# Patient Record
Sex: Male | Born: 1960 | Hispanic: Yes | Marital: Married | Smoking: Never smoker
Health system: Southern US, Community
[De-identification: ages and names within clinical notes are randomized; demographics above are authoritative.]

## PROBLEM LIST (undated history)

## (undated) DIAGNOSIS — I10 Essential (primary) hypertension: Secondary | ICD-10-CM

---

## 2020-10-02 ENCOUNTER — Emergency Department
Admission: EM | Admit: 2020-10-02 | Discharge: 2020-10-02 | Disposition: A | Discharge status: Home / Self Care | Payer: HRSA Program | Attending: Emergency Medicine | Admitting: Emergency Medicine

## 2020-10-02 ENCOUNTER — Other Ambulatory Visit: Payer: Self-pay

## 2020-10-02 ENCOUNTER — Encounter: Payer: Self-pay | Admitting: Emergency Medicine

## 2020-10-02 DIAGNOSIS — U071 COVID-19: Secondary | ICD-10-CM | POA: Insufficient documentation

## 2020-10-02 DIAGNOSIS — R509 Fever, unspecified: Secondary | ICD-10-CM | POA: Diagnosis present

## 2020-10-02 LAB — RESP PANEL BY RT-PCR (FLU A&B, COVID) ARPGX2
Influenza A by PCR: NEGATIVE
Influenza B by PCR: NEGATIVE
SARS Coronavirus 2 by RT PCR: POSITIVE — AB

## 2020-10-02 MED ORDER — DM-GUAIFENESIN ER 30-600 MG PO TB12: 1.0000 | ORAL_TABLET | Freq: Two times a day (BID) | ORAL | 0 refills | Status: AC

## 2020-10-02 MED ORDER — BENZONATATE 100 MG PO CAPS: 100.0000 mg | ORAL_CAPSULE | Freq: Three times a day (TID) | ORAL | 0 refills | Status: AC | PRN

## 2020-10-02 MED ORDER — ALBUTEROL SULFATE HFA 108 (90 BASE) MCG/ACT IN AERS: 2.0000 | INHALATION_SPRAY | Freq: Four times a day (QID) | RESPIRATORY_TRACT | 2 refills | Status: AC | PRN

## 2020-10-02 NOTE — Discharge Instructions (Signed)
Stay at home as much as possible until you recover  Take Tylenol and Ibuprofen for bodyaches and fever.  Return with new or worsening or symptoms.  You can use two puffs of Albuterol as needed for wheezing or shortness of breath.

## 2020-10-02 NOTE — ED Provider Notes (Signed)
[facility]-EMERGENCY DEPARTMENT  ____________________________________________  Time seen: Approximately 5:23 PM  I have reviewed the triage vital signs and the nursing notes.   HISTORY  Chief Complaint Generalized Body Aches   Historian Patient     HPI Kyle Wilson is a 59 y.o. male presents to the emergency department with fever, body aches, occasional cough and chills.  Patient states that he started having symptoms after he traveled to his daughter's house for the holidays.  He denies chest pain and chest tightness.  He states that he has had difficulty sleeping due to nasal congestion.  He states that his chills have improved but he is still having trouble sleeping.  There are no other sick contacts in the home with similar symptoms.  He denies emesis and diarrhea.  He reports that he has been otherwise healthy prior to onset of symptoms.  No other alleviating measures have been attempted.   History reviewed. No pertinent past medical history.   Immunizations up to date:  Yes.     History reviewed. No pertinent past medical history.  There are no problems to display for this patient.   History reviewed. No pertinent surgical history.  Prior to Admission medications   Medication Sig Start Date End Date Taking? Authorizing Provider  albuterol (VENTOLIN HFA) 108 (90 Base) MCG/ACT inhaler Inhale 2 puffs into the lungs every 6 (six) hours as needed for wheezing or shortness of breath. 10/02/20  Yes Pia Mau M, PA-C  benzonatate (TESSALON PERLES) 100 MG capsule Take 1 capsule (100 mg total) by mouth 3 (three) times daily as needed for up to 7 days for cough. 10/02/20 10/09/20 Yes Pia Mau M, PA-C  dextromethorphan-guaiFENesin (MUCINEX DM) 30-600 MG 12hr tablet Take 1 tablet by mouth 2 (two) times daily. 10/02/20  Yes Orvil Feil, PA-C    Allergies Patient has no known allergies.  No family history on file.  Social History Social History   Tobacco  Use  . Smoking status: Never Smoker  . Smokeless tobacco: Never Used     Review of Systems  Constitutional: Patient has chills.  Eyes:  No discharge ENT: No upper respiratory complaints. Respiratory: Patient has cough.  Gastrointestinal:   No nausea, no vomiting.  No diarrhea.  No constipation. Musculoskeletal: Negative for musculoskeletal pain. Skin: Negative for rash, abrasions, lacerations, ecchymosis.    ____________________________________________   PHYSICAL EXAM:  VITAL SIGNS: ED Triage Vitals  Enc Vitals Group     BP 10/02/20 1426 138/69     Pulse Rate 10/02/20 1426 88     Resp 10/02/20 1426 16     Temp 10/02/20 1426 99.4 F (37.4 C)     Temp Source 10/02/20 1426 Oral     SpO2 10/02/20 1426 96 %     Weight 10/02/20 1427 130 lb 1.1 oz (59 kg)     Height 10/02/20 1427 5' 4.57" (1.64 m)     Head Circumference --      Peak Flow --      Pain Score 10/02/20 1427 0     Pain Loc --      Pain Edu? --      Excl. in GC? --    Constitutional: Alert and oriented. Patient is lying supine. Eyes: Conjunctivae are normal. PERRL. EOMI. Head: Atraumatic. ENT:      Ears: Tympanic membranes are mildly injected with mild effusion bilaterally.       Nose: No congestion/rhinnorhea.      Mouth/Throat: Mucous membranes are moist. Posterior  pharynx is mildly erythematous.  Hematological/Lymphatic/Immunilogical: No cervical lymphadenopathy.  Cardiovascular: Normal rate, regular rhythm. Normal S1 and S2.  Good peripheral circulation. Respiratory: Normal respiratory effort without tachypnea or retractions. Lungs CTAB. Good air entry to the bases with no decreased or absent breath sounds. Gastrointestinal: Bowel sounds 4 quadrants. Soft and nontender to palpation. No guarding or rigidity. No palpable masses. No distention. No CVA tenderness. Musculoskeletal: Full range of motion to all extremities. No gross deformities appreciated. Neurologic:  Normal speech and language. No gross focal  neurologic deficits are appreciated.  Skin:  Skin is warm, dry and intact. No rash noted. Psychiatric: Mood and affect are normal. Speech and behavior are normal. Patient exhibits appropriate insight and judgement.    ____________________________________________   LABS (all labs ordered are listed, but only abnormal results are displayed)  Labs Reviewed  RESP PANEL BY RT-PCR (FLU A&B, COVID) ARPGX2 - Abnormal; Notable for the following components:      Result Value   SARS Coronavirus 2 by RT PCR POSITIVE (*)    All other components within normal limits   ____________________________________________  EKG   ____________________________________________  RADIOLOGY   No results found.  ____________________________________________    PROCEDURES  Procedure(s) performed:     Procedures     Medications - No data to display   ____________________________________________   INITIAL IMPRESSION / ASSESSMENT AND PLAN / ED COURSE  Pertinent labs & imaging results that were available during my care of the patient were reviewed by me and considered in my medical decision making (see chart for details).      Assessment and plan Viral URI with cough 59 year old male presents to the emergency department with viral URI-like symptoms.  He tested positive for COVID-19.  Vital signs are reassuring at triage.  On physical exam, patient was alert, active and nontoxic-appearing with no adventitious lung sounds auscultated.  He was discharged with Jerilynn Som, Mucinex DM and an albuterol inhaler.  Quarantine precautions were given.  Rest and hydration were encouraged at home.  Recommended returning for reevaluation if he develops worsening chest pain, chest tightness or shortness of breath.  Use of a Engineer, structural was used during this emergency department encounter.     ____________________________________________  FINAL CLINICAL IMPRESSION(S) / ED DIAGNOSES  Final  diagnoses:  COVID-19      NEW MEDICATIONS STARTED DURING THIS VISIT:  ED Discharge Orders         Ordered    albuterol (VENTOLIN HFA) 108 (90 Base) MCG/ACT inhaler  Every 6 hours PRN        10/02/20 1714    benzonatate (TESSALON PERLES) 100 MG capsule  3 times daily PRN        10/02/20 1714    dextromethorphan-guaiFENesin (MUCINEX DM) 30-600 MG 12hr tablet  2 times daily        10/02/20 1714              This chart was dictated using voice recognition software/Dragon. Despite best efforts to proofread, errors can occur which can change the meaning. Any change was purely unintentional.     Orvil Feil, PA-C 10/02/20 1726    Delton Prairie, MD 10/02/20 [phone]

## 2020-10-02 NOTE — ED Triage Notes (Signed)
C/O fevers, body aches, chills x 1 week.  States he hasn't been sleeping well either.  AAOx3.  Skin warm and dry.  NAD

## 2020-10-05 ENCOUNTER — Encounter: Payer: Self-pay | Admitting: Emergency Medicine

## 2020-10-05 ENCOUNTER — Inpatient Hospital Stay: Payer: Medicaid - Out of State

## 2020-10-05 ENCOUNTER — Emergency Department: Payer: Medicaid - Out of State

## 2020-10-05 ENCOUNTER — Inpatient Hospital Stay
Admission: EM | Admit: 2020-10-05 | Discharge: 2020-10-10 | DRG: 177 | Disposition: A | Discharge status: Home / Self Care | Payer: Medicaid - Out of State | Attending: Internal Medicine | Admitting: Internal Medicine

## 2020-10-05 ENCOUNTER — Other Ambulatory Visit: Payer: Self-pay

## 2020-10-05 DIAGNOSIS — U071 COVID-19: Principal | ICD-10-CM

## 2020-10-05 DIAGNOSIS — Z79899 Other long term (current) drug therapy: Secondary | ICD-10-CM | POA: Diagnosis not present

## 2020-10-05 DIAGNOSIS — J9600 Acute respiratory failure, unspecified whether with hypoxia or hypercapnia: Secondary | ICD-10-CM

## 2020-10-05 DIAGNOSIS — J1282 Pneumonia due to coronavirus disease 2019: Secondary | ICD-10-CM | POA: Diagnosis present

## 2020-10-05 DIAGNOSIS — R0902 Hypoxemia: Secondary | ICD-10-CM

## 2020-10-05 DIAGNOSIS — E785 Hyperlipidemia, unspecified: Secondary | ICD-10-CM | POA: Diagnosis present

## 2020-10-05 DIAGNOSIS — I10 Essential (primary) hypertension: Secondary | ICD-10-CM | POA: Diagnosis present

## 2020-10-05 DIAGNOSIS — J9601 Acute respiratory failure with hypoxia: Secondary | ICD-10-CM | POA: Diagnosis present

## 2020-10-05 HISTORY — DX: Essential (primary) hypertension: I10

## 2020-10-05 LAB — CBC
HCT: 41.9 % (ref 39.0–52.0)
HCT: 36.9 % — ABNORMAL LOW (ref 39.0–52.0)
Hemoglobin: 14.3 g/dL (ref 13.0–17.0)
Hemoglobin: 13 g/dL (ref 13.0–17.0)
MCH: 30.6 pg (ref 26.0–34.0)
MCH: 31.3 pg (ref 26.0–34.0)
MCHC: 34.1 g/dL (ref 30.0–36.0)
MCHC: 35.2 g/dL (ref 30.0–36.0)
MCV: 89.5 fL (ref 80.0–100.0)
MCV: 88.7 fL (ref 80.0–100.0)
Platelets: 402 10*3/uL — ABNORMAL HIGH (ref 150–400)
Platelets: 359 10*3/uL (ref 150–400)
RBC: 4.68 MIL/uL (ref 4.22–5.81)
RBC: 4.16 MIL/uL — ABNORMAL LOW (ref 4.22–5.81)
RDW: 12.3 % (ref 11.5–15.5)
RDW: 12.2 % (ref 11.5–15.5)
WBC: 8.5 10*3/uL (ref 4.0–10.5)
WBC: 8.2 10*3/uL (ref 4.0–10.5)
nRBC: 0 % (ref 0.0–0.2)
nRBC: 0 % (ref 0.0–0.2)

## 2020-10-05 LAB — BASIC METABOLIC PANEL
Anion gap: 14 (ref 5–15)
BUN: 18 mg/dL (ref 6–20)
CO2: 22 mmol/L (ref 22–32)
Calcium: 9 mg/dL (ref 8.9–10.3)
Chloride: 104 mmol/L (ref 98–111)
Creatinine, Ser: 0.77 mg/dL (ref 0.61–1.24)
GFR, Estimated: >60 mL/min (ref >60)
Glucose, Bld: 117 mg/dL — ABNORMAL HIGH (ref 70–99)
Potassium: 3.7 mmol/L (ref 3.5–5.1)
Sodium: 140 mmol/L (ref 135–145)

## 2020-10-05 LAB — CREATININE, SERUM
Creatinine, Ser: 0.76 mg/dL (ref 0.61–1.24)
GFR, Estimated: >60 mL/min (ref >60)

## 2020-10-05 LAB — TROPONIN I (HIGH SENSITIVITY)
Troponin I (High Sensitivity): 6 ng/L (ref <18)
Troponin I (High Sensitivity): 5 ng/L (ref <18)

## 2020-10-05 MED ORDER — ACETAMINOPHEN 500 MG PO TABS
1000.0000 mg | ORAL_TABLET | Freq: Once | ORAL | Status: AC
  Administered 2020-10-05: 19:00:00 1000 mg via ORAL
  Filled 2020-10-05: qty 2

## 2020-10-05 MED ORDER — SODIUM CHLORIDE 0.9 % IV SOLN
200.0000 mg | Freq: Once | INTRAVENOUS | Status: AC
  Administered 2020-10-05: 200 mg via INTRAVENOUS
  Filled 2020-10-05: qty 40

## 2020-10-05 MED ORDER — ASCORBIC ACID 500 MG PO TABS
500.0000 mg | ORAL_TABLET | Freq: Every day | ORAL | Status: DC
  Administered 2020-10-06 – 2020-10-09 (×4): 500 mg via ORAL
  Filled 2020-10-05 (×4): qty 1

## 2020-10-05 MED ORDER — IOHEXOL 350 MG/ML SOLN
75.0000 mL | Freq: Once | INTRAVENOUS | Status: AC | PRN
  Administered 2020-10-05: 75 mL via INTRAVENOUS

## 2020-10-05 MED ORDER — ONDANSETRON HCL 4 MG PO TABS: 4.0000 mg | ORAL_TABLET | Freq: Four times a day (QID) | ORAL | Status: DC | PRN

## 2020-10-05 MED ORDER — PREDNISONE 50 MG PO TABS
50.0000 mg | ORAL_TABLET | Freq: Every day | ORAL | Status: DC
  Administered 2020-10-09: 09:00:00 50 mg via ORAL
  Filled 2020-10-05: qty 1

## 2020-10-05 MED ORDER — LACTATED RINGERS IV BOLUS
1000.0000 mL | Freq: Once | INTRAVENOUS | Status: AC
  Administered 2020-10-05: 20:00:00 1000 mL via INTRAVENOUS

## 2020-10-05 MED ORDER — METHYLPREDNISOLONE SODIUM SUCC 40 MG IJ SOLR
0.5000 mg/kg | Freq: Two times a day (BID) | INTRAMUSCULAR | Status: AC
  Administered 2020-10-06 – 2020-10-08 (×6): 29.2 mg via INTRAVENOUS
  Filled 2020-10-05 (×6): qty 1

## 2020-10-05 MED ORDER — GUAIFENESIN-DM 100-10 MG/5ML PO SYRP: 10.0000 mL | ORAL_SOLUTION | ORAL | Status: DC | PRN

## 2020-10-05 MED ORDER — ONDANSETRON HCL 4 MG/2ML IJ SOLN: 4.0000 mg | Freq: Four times a day (QID) | INTRAMUSCULAR | Status: DC | PRN

## 2020-10-05 MED ORDER — KETOROLAC TROMETHAMINE 30 MG/ML IJ SOLN
30.0000 mg | Freq: Once | INTRAMUSCULAR | Status: DC
  Filled 2020-10-05: qty 1

## 2020-10-05 MED ORDER — ACETAMINOPHEN 325 MG PO TABS: 650.0000 mg | ORAL_TABLET | Freq: Four times a day (QID) | ORAL | Status: DC | PRN

## 2020-10-05 MED ORDER — KETOROLAC TROMETHAMINE 30 MG/ML IJ SOLN
30.0000 mg | Freq: Once | INTRAMUSCULAR | Status: AC
  Administered 2020-10-05: 20:00:00 30 mg via INTRAVENOUS

## 2020-10-05 MED ORDER — SODIUM CHLORIDE 0.9 % IV SOLN
100.0000 mg | Freq: Every day | INTRAVENOUS | Status: AC
  Administered 2020-10-06 – 2020-10-09 (×4): 100 mg via INTRAVENOUS
  Filled 2020-10-05 (×2): qty 20
  Filled 2020-10-05: qty 100
  Filled 2020-10-05 (×2): qty 20

## 2020-10-05 MED ORDER — HYDROCOD POLST-CPM POLST ER 10-8 MG/5ML PO SUER
5.0000 mL | Freq: Two times a day (BID) | ORAL | Status: DC | PRN
  Administered 2020-10-06: 5 mL via ORAL
  Filled 2020-10-05: qty 5

## 2020-10-05 MED ORDER — ENOXAPARIN SODIUM 40 MG/0.4ML ~~LOC~~ SOLN
40.0000 mg | SUBCUTANEOUS | Status: DC
  Administered 2020-10-06 – 2020-10-09 (×5): 40 mg via SUBCUTANEOUS
  Filled 2020-10-05 (×5): qty 0.4

## 2020-10-05 MED ORDER — DEXAMETHASONE SODIUM PHOSPHATE 10 MG/ML IJ SOLN
6.0000 mg | Freq: Once | INTRAMUSCULAR | Status: AC
  Administered 2020-10-05: 20:00:00 6 mg via INTRAVENOUS
  Filled 2020-10-05: qty 1

## 2020-10-05 MED ORDER — ZINC SULFATE 220 (50 ZN) MG PO CAPS
220.0000 mg | ORAL_CAPSULE | Freq: Every day | ORAL | Status: DC
  Administered 2020-10-06 – 2020-10-09 (×4): 220 mg via ORAL
  Filled 2020-10-05 (×4): qty 1

## 2020-10-05 MED ORDER — ALBUTEROL SULFATE HFA 108 (90 BASE) MCG/ACT IN AERS
2.0000 | INHALATION_SPRAY | Freq: Four times a day (QID) | RESPIRATORY_TRACT | Status: DC
  Administered 2020-10-06 – 2020-10-09 (×16): 2 via RESPIRATORY_TRACT
  Filled 2020-10-05: qty 6.7

## 2020-10-05 NOTE — Consult Note (Addendum)
Remdesivir - Pharmacy Brief Note   O:  CXR: 12/30 Patchy bibasilar pneumonia. SpO2: 83% on Room air --> 6L [place] to 96%   A/P:  Remdesivir 200 mg IVPB once followed by 100 mg IVPB daily x 4 days.   Martyn Malay, [facility] 10/05/2020 8:38 PM

## 2020-10-05 NOTE — ED Triage Notes (Signed)
Pt comes into the ED via [facility] from home c/o increased CP and SHOB since being diagnosed with COVID on Sunday.  Pt has been using the medication given to him when he was initially discharged from the hospital a couple of days ago, but he is still having worsening SHOB and CP. Pt does not normally wear O2.  Pt originally 83% RA and was placed on 6L nasal cannula and is now up to 96%.

## 2020-10-05 NOTE — ED Notes (Signed)
Patient transported to CT at this time. 

## 2020-10-05 NOTE — H&P (Addendum)
History and Physical    Kyle Wilson OZH:[id number] DOB: 1960-11-29 DOA: 10/05/2020  PCP: Pcp, No   Patient coming from: Home  I have personally briefly reviewed patient's old medical records in [facility]  Chief Complaint: Shortness of breath, Covid positive  HPI: Kyle Wilson is a 59 y.o. male with medical history significant for hypertension who presents to the emergency room with worsening cough and shortness of breath.  Patient became symptomatic on 12/26 and presented to the emergency room on 12/27 when he was diagnosed with Covid.  He was treated and discharged however he returns via EMS with shortness of breath not improving with albuterol prescribed at his recent visit as well as chest pain, and persistent weakness, poor oral intake, persistent fevers and muscle aches. Chest pain is sharp, worse on deep inspiratio nand on moving, felt in anterior chest, no aggravating or alleviating factors EMS reported O2 sat of 83% on room air and patient required 6 L via nasal cannula to keep sats around 96%  ED Course: On arrival temperature 99.9, BP 121/58 with pulse of 73 respirations 20 with O2 sat 97% on room air.  Blood work including CBC, BMP and troponin were all within normal limits.  No other blood work available at this time.   EKG as reviewed by me : Normal sinus rhythm at 73 with no acute ST-T wave changes Imaging: Patchy bibasilar pneumonia  Review of Systems: As per HPI otherwise all other systems on review of systems negative.    Past Medical History:  Diagnosis Date  . Hypertension     History reviewed. No pertinent surgical history.   reports that he has never smoked. He has never used smokeless tobacco. He reports previous alcohol use. He reports previous drug use.  No Known Allergies  History reviewed. No pertinent family history.    Prior to Admission medications   Medication Sig Start Date End Date Taking? Authorizing Provider   albuterol (VENTOLIN HFA) 108 (90 Base) MCG/ACT inhaler Inhale 2 puffs into the lungs every 6 (six) hours as needed for wheezing or shortness of breath. 10/02/20   Orvil Feil, PA-C  amLODipine (NORVASC) 10 MG tablet Take 10 mg by mouth daily. 09/22/20   [provider]  benzonatate (TESSALON PERLES) 100 MG capsule Take 1 capsule (100 mg total) by mouth 3 (three) times daily as needed for up to 7 days for cough. 10/02/20 10/09/20  Orvil Feil, PA-C  dextromethorphan-guaiFENesin (MUCINEX DM) 30-600 MG 12hr tablet Take 1 tablet by mouth 2 (two) times daily. 10/02/20   Orvil Feil, PA-C  simvastatin (ZOCOR) 20 MG tablet Take 20 mg by mouth at bedtime. 09/15/20   [provider]    Physical Exam: Vitals:   10/05/20 1506 10/05/20 1508 10/05/20 1820 10/05/20 1952  BP: (!) 121/58  128/74 (!) 152/80  Pulse: 73  77 64  Resp: 20  20 (!) 33  Temp: 99.9 F (37.7 C)     TempSrc: Oral     SpO2: 97%  97% 98%  Weight:  58 kg    Height:  5\' 4"  (1.626 m)       Vitals:   10/05/20 1506 10/05/20 1508 10/05/20 1820 10/05/20 1952  BP: (!) 121/58  128/74 (!) 152/80  Pulse: 73  77 64  Resp: 20  20 (!) 33  Temp: 99.9 F (37.7 C)     TempSrc: Oral     SpO2: 97%  97% 98%  Weight:  58 kg    Height:  5\' 4"  (1.626 m)        Constitutional: Alert and oriented x 3 . Conversational dyspnea HEENT:      Head: Normocephalic and atraumatic.         Eyes: PERLA, EOMI, Conjunctivae are normal. Sclera is non-icteric.       Mouth/Throat: Mucous membranes are moist.       Neck: Supple with no signs of meningismus. Cardiovascular: Regular rate and rhythm. No murmurs, gallops, or rubs. 2+ symmetrical distal pulses are present . No JVD. No LE edema Respiratory: Respiratory effort increased . Coarse Lungs sounds throughout.   Gastrointestinal: Soft, non tender, and non distended with positive bowel sounds.  Genitourinary: No CVA tenderness. Musculoskeletal: Nontender with normal range of  motion in all extremities. No cyanosis, or erythema of extremities. Neurologic:  Face is symmetric. Moving all extremities. No gross focal neurologic deficits . Skin: Skin is warm, dry.  No rash or ulcers Psychiatric: Mood and affect are normal    Labs on Admission: I have personally reviewed following labs and imaging studies  CBC: Recent Labs  Lab 10/05/20 1507  WBC 8.5  HGB 14.3  HCT 41.9  MCV 89.5  PLT 402*   Basic Metabolic Panel: Recent Labs  Lab 10/05/20 1507  NA 140  K 3.7  CL 104  CO2 22  GLUCOSE 117*  BUN 18  CREATININE 0.77  CALCIUM 9.0   GFR: Estimated Creatinine Clearance: 81.6 mL/min (by C-G formula based on SCr of 0.77 mg/dL). Liver Function Tests: No results for input(s): AST, ALT, ALKPHOS, BILITOT, PROT, ALBUMIN in the last 168 hours. No results for input(s): LIPASE, AMYLASE in the last 168 hours. No results for input(s): AMMONIA in the last 168 hours. Coagulation Profile: No results for input(s): INR, PROTIME in the last 168 hours. Cardiac Enzymes: No results for input(s): CKTOTAL, CKMB, CKMBINDEX, TROPONINI in the last 168 hours. BNP (last 3 results) No results for input(s): PROBNP in the last 8760 hours. HbA1C: No results for input(s): HGBA1C in the last 72 hours. CBG: No results for input(s): GLUCAP in the last 168 hours. Lipid Profile: No results for input(s): CHOL, HDL, LDLCALC, TRIG, CHOLHDL, LDLDIRECT in the last 72 hours. Thyroid Function Tests: No results for input(s): TSH, T4TOTAL, FREET4, T3FREE, THYROIDAB in the last 72 hours. Anemia Panel: No results for input(s): VITAMINB12, FOLATE, FERRITIN, TIBC, IRON, RETICCTPCT in the last 72 hours. Urine analysis: No results found for: COLORURINE, APPEARANCEUR, LABSPEC, PHURINE, GLUCOSEU, HGBUR, BILIRUBINUR, KETONESUR, PROTEINUR, UROBILINOGEN, NITRITE, LEUKOCYTESUR  Radiological Exams on Admission: DG Chest 2 View  Result Date: 10/05/2020 CLINICAL DATA:  Chest pain, dyspnea. EXAM: CHEST  - 2 VIEW COMPARISON:  None. FINDINGS: The heart size and mediastinal contours are within normal limits. No pneumothorax or pleural effusion is noted. Patchy bibasilar airspace opacities are noted concerning for multifocal pneumonia. The visualized skeletal structures are unremarkable. IMPRESSION: Patchy bibasilar pneumonia. Electronically Signed   By: 10/07/2020 M.D.   On: 10/05/2020 15:54     Assessment/Plan 59 year old male with history of hypertension, recently diagnosed with Covid on 12/26 presenting with chest pain, worsening shortness of breath with O2 sat 83% on room air requiring 6 L to keep sats in the mid 90s.  Patient received the 1/27 and [place] covid vaccine in April 2021    Acute respiratory failure due to COVID-19 [facility])   Pneumonia due to COVID-19 virus -Remdesivir, Solu-Medrol, albuterol antitussives and vitamins -Oxygen to keep sats over 92%  with proning as tolerated -Trend inflammatory biomarkers  Chest pain,  -Likely related to increased work of breathing  --follow CTA chest to evaluate for acute PE -Troponin negative x2.  ACS not suspected    HTN (hypertension) -Continue amlodipine    DVT prophylaxis: Lovenox  Code Status: full code  Family Communication:  none  Disposition Plan: Back to previous home environment Consults called: none  Status:At the time of admission, it appears that the appropriate admission status for this patient is INPATIENT. This is judged to be reasonable and necessary in order to provide the required intensity of service to ensure the patient's safety given the presenting symptoms, physical exam findings, and initial radiographic and laboratory data in the context of their  Comorbid conditions.   Patient requires inpatient status due to high intensity of service, high risk for further deterioration and high frequency of surveillance required.   I certify that at the point of admission it is my clinical judgment that the patient will  require inpatient hospital care spanning beyond 2 midnights     Andris Baumann MD [facility]     10/05/2020, 8:34 PM

## 2020-10-05 NOTE — ED Provider Notes (Signed)
[facility] Emergency Department Provider Note ____________________________________________   Event Date/Time   First MD Initiated Contact with Patient 10/05/20 1834     (approximate)  I have reviewed the triage vital signs and the nursing notes.  HISTORY  Chief Complaint Chest Pain and Shortness of Breath   HPI Kyle Wilson is a 59 y.o. malewho presents to the ED for evaluation of worsening shortness of breath.  Chart review indicates patient seen in the ED in our fast-track section 3 days ago for fever, myalgias and was diagnosed with COVID-19.  He was provided albuterol, antitussives and discharged home.  Patient reports getting just 1 shots in the COVID-19 vaccination series.  Patient returns to the ED today with continued shortness of breath, indicating that he feels no better after taking albuterol.  He reports continued sensation of generalized weakness, poor p.o. intake, intermittent fevers and diffuse myalgias.  Reports continued nonproductive cough, without any new production.  Denies any syncopal episodes, trauma, emesis, dysuria.  He reports that he has not taken any medications other than what was provided to him.  History and physical facilitated by in-person Spanish interpreter.  Past Medical History:  Diagnosis Date  . Hypertension     There are no problems to display for this patient.   History reviewed. No pertinent surgical history.  Prior to Admission medications   Medication Sig Start Date End Date Taking? Authorizing Provider  albuterol (VENTOLIN HFA) 108 (90 Base) MCG/ACT inhaler Inhale 2 puffs into the lungs every 6 (six) hours as needed for wheezing or shortness of breath. 10/02/20   Orvil Feil, PA-C  benzonatate (TESSALON PERLES) 100 MG capsule Take 1 capsule (100 mg total) by mouth 3 (three) times daily as needed for up to 7 days for cough. 10/02/20 10/09/20  Orvil Feil, PA-C  dextromethorphan-guaiFENesin  (MUCINEX DM) 30-600 MG 12hr tablet Take 1 tablet by mouth 2 (two) times daily. 10/02/20   Orvil Feil, PA-C    Allergies Patient has no known allergies.  History reviewed. No pertinent family history.  Social History Social History   Tobacco Use  . Smoking status: Never Smoker  . Smokeless tobacco: Never Used  Substance Use Topics  . Alcohol use: Not Currently  . Drug use: Not Currently    Review of Systems  Constitutional: Positive for subjective fevers and chills Eyes: No visual changes. ENT: No sore throat. Cardiovascular: Denies chest pain. Respiratory: Positive for nonproductive cough and shortness of breath. Gastrointestinal: No abdominal pain.  No nausea, no vomiting.  No diarrhea.  No constipation. Positive for poor p.o. intake Genitourinary: Negative for dysuria. Musculoskeletal: Negative for back pain. Skin: Negative for rash. Neurological: Negative for headaches, focal weakness or numbness.  ____________________________________________   PHYSICAL EXAM:  VITAL SIGNS: Vitals:   10/05/20 1506 10/05/20 1820  BP: (!) 121/58 128/74  Pulse: 73 77  Resp: 20 20  Temp: 99.9 F (37.7 C)   SpO2: 97% 97%     Constitutional: Alert and oriented.  Appears uncomfortable, but is conversational full sentences via interpreter. Eyes: Conjunctivae are normal. PERRL. EOMI. Head: Atraumatic. Nose: No congestion/rhinnorhea. Mouth/Throat: Mucous membranes are dry.  Oropharynx non-erythematous. Neck: No stridor. No cervical spine tenderness to palpation. Cardiovascular: Normal rate, regular rhythm. Grossly normal heart sounds.  Good peripheral circulation. Respiratory: Minimal tachypnea to the low 20s, but no further evidence of distress.  Clear lungs throughout with good air movement. Gastrointestinal: Soft , nondistended, nontender to palpation. No CVA tenderness. Musculoskeletal:  No lower extremity tenderness nor edema.  No joint effusions. No signs of acute  trauma. Neurologic:  Normal speech and language. No gross focal neurologic deficits are appreciated. No gait instability noted. Skin:  Skin is warm, dry and intact. No rash noted. Psychiatric: Mood and affect are normal. Speech and behavior are normal.  ____________________________________________   LABS (all labs ordered are listed, but only abnormal results are displayed)  Labs Reviewed  BASIC METABOLIC PANEL - Abnormal; Notable for the following components:      Result Value   Glucose, Bld 117 (*)    All other components within normal limits  CBC - Abnormal; Notable for the following components:   Platelets 402 (*)    All other components within normal limits  TROPONIN I (HIGH SENSITIVITY)  TROPONIN I (HIGH SENSITIVITY)   ____________________________________________  12 Lead EKG  Sinus rhythm, rate of 73 bpm.  Normal axis and intervals.  No evidence of acute ischemia. ____________________________________________  RADIOLOGY  ED MD interpretation: 2 view CXR reviewed by me with patchy multifocal infiltrates consistent with COVID-19 without evidence of discrete lobar superimposed infiltration  Official radiology report(s): DG Chest 2 View  Result Date: 10/05/2020 CLINICAL DATA:  Chest pain, dyspnea. EXAM: CHEST - 2 VIEW COMPARISON:  None. FINDINGS: The heart size and mediastinal contours are within normal limits. No pneumothorax or pleural effusion is noted. Patchy bibasilar airspace opacities are noted concerning for multifocal pneumonia. The visualized skeletal structures are unremarkable. IMPRESSION: Patchy bibasilar pneumonia. Electronically Signed   By: Lupita Raider M.D.   On: 10/05/2020 15:54    ____________________________________________   PROCEDURES and INTERVENTIONS  Procedure(s) performed (including Critical Care):  .1-3 Lead EKG Interpretation Performed by: Delton Prairie, MD Authorized by: Delton Prairie, MD     Interpretation: normal     ECG rate:  76    ECG rate assessment: normal     Rhythm: sinus rhythm     Ectopy: none     Conduction: normal    .Critical Care Performed by: Delton Prairie, MD Authorized by: Delton Prairie, MD   Critical care provider statement:    Critical care time (minutes):  30   Critical care was necessary to treat or prevent imminent or life-threatening deterioration of the following conditions:  Respiratory failure   Critical care was time spent personally by me on the following activities:  Discussions with consultants, evaluation of patient's response to treatment, examination of patient, ordering and performing treatments and interventions, ordering and review of laboratory studies, ordering and review of radiographic studies, pulse oximetry, re-evaluation of patient's condition, obtaining history from patient or surrogate and review of old charts    Medications  lactated ringers bolus 1,000 mL (has no administration in time range)  ketorolac (TORADOL) 30 MG/ML injection 30 mg (has no administration in time range)  dexamethasone (DECADRON) injection 6 mg (has no administration in time range)  acetaminophen (TYLENOL) tablet 1,000 mg (1,000 mg Oral Given 10/05/20 1914)    ____________________________________________   MDM / ED COURSE   59 year old male presents to the ED with continued and worsening shortness of breath in the setting of COVID-19, with associated hypoxia necessitating medical admission.  Patient hypoxic to about 83% on room air necessitating 5 L nasal cannula, otherwise hemodynamically stable.  Exam with isolated tachypnea and stigmata of dehydration.  No significant distress.  Blood work unremarkable.  CXR with expected infiltrates in the setting of COVID-19.  Will provide Decadron due to severity of his disease and hypoxia,  and admit to medicine.   Clinical Course as of 10/05/20 1937  Thu Oct 05, 2020  1935 Nurse informs me of hypoxia to the mid 80s necessitating 5 L nasal cannula.  We  discussed Decadron and medical admission, patient is agreeable. [DS]    Clinical Course User Index [DS] Delton Prairie, MD    ____________________________________________   FINAL CLINICAL IMPRESSION(S) / ED DIAGNOSES  Final diagnoses:  COVID-19  Hypoxia     ED Discharge Orders    None       Kyle Wilson   Note:  This document was prepared using Dragon voice recognition software and may include unintentional dictation errors.   Delton Prairie, MD 10/05/20 [phone]

## 2020-10-06 ENCOUNTER — Encounter: Payer: Self-pay | Admitting: Internal Medicine

## 2020-10-06 LAB — CBC WITH DIFFERENTIAL/PLATELET
Abs Immature Granulocytes: 0.04 10*3/uL (ref 0.00–0.07)
Basophils Absolute: 0 10*3/uL (ref 0.0–0.1)
Basophils Relative: 0 %
Eosinophils Absolute: 0 10*3/uL (ref 0.0–0.5)
Eosinophils Relative: 0 %
HCT: 37.5 % — ABNORMAL LOW (ref 39.0–52.0)
Hemoglobin: 13.1 g/dL (ref 13.0–17.0)
Immature Granulocytes: 1 %
Lymphocytes Relative: 11 %
Lymphs Abs: 0.6 10*3/uL — ABNORMAL LOW (ref 0.7–4.0)
MCH: 31.3 pg (ref 26.0–34.0)
MCHC: 34.9 g/dL (ref 30.0–36.0)
MCV: 89.7 fL (ref 80.0–100.0)
Monocytes Absolute: 0.2 10*3/uL (ref 0.1–1.0)
Monocytes Relative: 4 %
Neutro Abs: 4.2 10*3/uL (ref 1.7–7.7)
Neutrophils Relative %: 84 %
Platelets: 409 10*3/uL — ABNORMAL HIGH (ref 150–400)
RBC: 4.18 MIL/uL — ABNORMAL LOW (ref 4.22–5.81)
RDW: 12 % (ref 11.5–15.5)
WBC: 5 10*3/uL (ref 4.0–10.5)
nRBC: 0 % (ref 0.0–0.2)

## 2020-10-06 LAB — COMPREHENSIVE METABOLIC PANEL
ALT: 51 U/L — ABNORMAL HIGH (ref 0–44)
AST: 36 U/L (ref 15–41)
Albumin: 3 g/dL — ABNORMAL LOW (ref 3.5–5.0)
Alkaline Phosphatase: 53 U/L (ref 38–126)
Anion gap: 9 (ref 5–15)
BUN: 20 mg/dL (ref 6–20)
CO2: 23 mmol/L (ref 22–32)
Calcium: 8.4 mg/dL — ABNORMAL LOW (ref 8.9–10.3)
Chloride: 106 mmol/L (ref 98–111)
Creatinine, Ser: 0.86 mg/dL (ref 0.61–1.24)
GFR, Estimated: >60 mL/min (ref >60)
Glucose, Bld: 218 mg/dL — ABNORMAL HIGH (ref 70–99)
Potassium: 4.1 mmol/L (ref 3.5–5.1)
Sodium: 138 mmol/L (ref 135–145)
Total Bilirubin: 0.8 mg/dL (ref 0.3–1.2)
Total Protein: 6.7 g/dL (ref 6.5–8.1)

## 2020-10-06 LAB — HIV ANTIBODY (ROUTINE TESTING W REFLEX): HIV Screen 4th Generation wRfx: NONREACTIVE

## 2020-10-06 LAB — FIBRIN DERIVATIVES D-DIMER (ARMC ONLY): Fibrin derivatives D-dimer (ARMC): 939.23 ng/mL (FEU) — ABNORMAL HIGH (ref 0.00–499.00)

## 2020-10-06 LAB — C-REACTIVE PROTEIN: CRP: 12.6 mg/dL — ABNORMAL HIGH (ref <1.0)

## 2020-10-06 NOTE — ED Notes (Signed)
Patient provided snack and drink. Given urinal. Lights dimmed for comfort. Denies further need at this time.

## 2020-10-06 NOTE — Progress Notes (Signed)
PROGRESS NOTE    Kyle Wilson  MWU:[id number] DOB: 1960/11/25 DOA: 10/05/2020 PCP: Oneita Hurt, No    Brief Narrative:  Kyle Wilson is a 59 year old male history significant for essential hypertension who presented to the ED via EMS with progressive cough and shortness of breath.  Patient reports associated weakness, poor oral intake, fevers and muscle aches.  On arrival, EMS noted oxygen saturation 83% on room air and requiring 6 L nasal cannula to maintain sats around 96%.  Patient was originally seen in the emergency department on 10/02/2020 and diagnosed with Covid-19 but he was not hypoxic and discharged home.  In the ED, temperature 99.9, BP 121/58, HR 73, RR 20, SPO2 97% on 6 L nasal cannula.  WBC 8.5, hemoglobin 14.3, platelets 402, sodium 140, potassium 3.7, chloride 104, CO2 22, glucose 117, BUN 18, creatinine 0.77.  Chest x-ray with patchy bibasilar pneumonia.  CT angiogram chest with no central/segmental/subsegmental pulmonary embolism but does note multifocal airspace opacities consistent with atypical viral pneumonia.  Hospital service consulted for further evaluation and management of acute hypoxic respiratory failure secondary to Covid-19 viral pneumonia.   Assessment & Plan:   Principal Problem:   Acute respiratory failure due to COVID-19 [facility]) Active Problems:   HTN (hypertension)   Pneumonia due to COVID-19 virus   Acute hypoxic respiratory failure secondary to acute Covid-19 viral pneumonia during the ongoing 2020/2021 Covid 19 Pandemic - POA Patient presenting with progressive shortness of breath and nonproductive cough associated with weakness, fatigue, body aches, fever and poor oral intake.  Originally diagnosed with Covid-19 by positive PCR on 10/02/2020.  Was found to be hypoxic by EMS with 83% on room air.  Chest x-ray and CT angiogram chest consistent with multifocal pneumonia.  Unvaccinated against Covid-19. --COVID test: + PCR 10/02/2020 --CRP  pending --ddimer [id number] --Remdesivir, plan 5-day course (Day #2/5) --Solumedrol 29.2mg  IV q12h x 3 days, followed by prednisone 50 mg p.o. daily --prone for 2-3hrs every 12hrs if able --Continue supplemental oxygen, titrate to maintain SPO2 greater than 92%, on 6 L nasal cannula with SPO2 96% --Continue supportive care with albuterol MDI prn, vitamin C, zinc, Tylenol, antitussives (benzonatate/ Mucinex/Tussionex) --Follow CBC, CMP, D-dimer, and CRP daily --Continue airborne/contact isolation precautions for 3 weeks from the day of diagnosis  The treatment plan and use of medications and known side effects were discussed with patient/family. Some of the medications used are based on case reports/anecdotal data.  All other medications being used in the management of COVID-19 based on limited study data.  Complete risks and long-term side effects are unknown, however in the best clinical judgment they seem to be of some benefit.  Patient wanted to proceed with treatment options provided.  Essential hypertension Home medications include amlodipine 10 mg p.o. daily --BP 111/66 --Hold home antihypertensives --Continue monitor blood pressure closely  Hyperlipidemia: Hold home simvastatin 20 mg p.o. daily   DVT prophylaxis: Lovenox Code Status: Full code Family Communication: Updated patient extensively at bedside  Disposition Plan:  Status is: Inpatient  Remains inpatient appropriate because:Unsafe d/c plan, IV treatments appropriate due to intensity of illness or inability to take PO and Inpatient level of care appropriate due to severity of illness   Dispo: The patient is from: Home              Anticipated d/c is to: Home              Anticipated d/c date is: > 3 days  Patient currently is not medically stable to d/c.   Consultants:   None  Procedures:   None  Antimicrobials:   None   Subjective: Patient seen and examined bedside, resting comfortably in bed.   Continues in ED holding area.  Continues with shortness of breath, weakness, fatigue and nonproductive cough.  No other questions or concerns at this time.  Continues to require supplemental oxygen, on 6 L nasal cannula.  Denies headache, no visual changes, no chest pain, no palpitations, no abdominal pain, no fever/chills/night sweats, no nausea/vomiting/diarrhea, no abdominal pain, no paresthesias.  No acute events overnight per nursing staff.  Objective: Vitals:   10/06/20 0200 10/06/20 0300 10/06/20 0818 10/06/20 1130  BP: 115/75 111/66 (!) 143/83 135/74  Pulse: 80 61 65 70  Resp:  (!) 23 (!) 26 (!) 35  Temp:      TempSrc:      SpO2: 95% 96% 94% 91%  Weight:      Height:        Intake/Output Summary (Last 24 hours) at 10/06/2020 1220 Last data filed at 10/06/2020 0122 Gross per 24 hour  Intake 1290.53 ml  Output 400 ml  Net 890.53 ml   Filed Weights   10/05/20 1508  Weight: 58 kg    Examination:  General exam: Appears calm and comfortable  Respiratory system: Clear to auscultation. Respiratory effort normal.  On 6 L nasal cannula with SPO2 96% Cardiovascular system: S1 & S2 heard, RRR. No JVD, murmurs, rubs, gallops or clicks. No pedal edema. Gastrointestinal system: Abdomen is nondistended, soft and nontender. No organomegaly or masses felt. Normal bowel sounds heard. Central nervous system: Alert and oriented. No focal neurological deficits. Extremities: Symmetric 5 x 5 power. Skin: No rashes, lesions or ulcers Psychiatry: Judgement and insight appear normal. Mood & affect appropriate.     Data Reviewed: I have personally reviewed following labs and imaging studies  CBC: Recent Labs  Lab 10/05/20 1507 10/05/20 2100 10/06/20 0513  WBC 8.5 8.2 5.0  NEUTROABS  --   --  4.2  HGB 14.3 13.0 13.1  HCT 41.9 36.9* 37.5*  MCV 89.5 88.7 89.7  PLT 402* 359 409*   Basic Metabolic Panel: Recent Labs  Lab 10/05/20 1507 10/05/20 2100 10/06/20 0513  NA 140  --  138   K 3.7  --  4.1  CL 104  --  106  CO2 22  --  23  GLUCOSE 117*  --  218*  BUN 18  --  20  CREATININE 0.77 0.76 0.86  CALCIUM 9.0  --  8.4*   GFR: Estimated Creatinine Clearance: 75.9 mL/min (by C-G formula based on SCr of 0.86 mg/dL). Liver Function Tests: Recent Labs  Lab 10/06/20 0513  AST 36  ALT 51*  ALKPHOS 53  BILITOT 0.8  PROT 6.7  ALBUMIN 3.0*   No results for input(s): LIPASE, AMYLASE in the last 168 hours. No results for input(s): AMMONIA in the last 168 hours. Coagulation Profile: No results for input(s): INR, PROTIME in the last 168 hours. Cardiac Enzymes: No results for input(s): CKTOTAL, CKMB, CKMBINDEX, TROPONINI in the last 168 hours. BNP (last 3 results) No results for input(s): PROBNP in the last 8760 hours. HbA1C: No results for input(s): HGBA1C in the last 72 hours. CBG: No results for input(s): GLUCAP in the last 168 hours. Lipid Profile: No results for input(s): CHOL, HDL, LDLCALC, TRIG, CHOLHDL, LDLDIRECT in the last 72 hours. Thyroid Function Tests: No results for input(s): TSH, T4TOTAL, FREET4,  T3FREE, THYROIDAB in the last 72 hours. Anemia Panel: No results for input(s): VITAMINB12, FOLATE, FERRITIN, TIBC, IRON, RETICCTPCT in the last 72 hours. Sepsis Labs: No results for input(s): PROCALCITON, LATICACIDVEN in the last 168 hours.  Recent Results (from the past 240 hour(s))  Resp Panel by RT-PCR (Flu A&B, Covid) Nasopharyngeal Swab     Status: Abnormal   Collection Time: 10/02/20  2:32 PM   Specimen: Nasopharyngeal Swab; Nasopharyngeal(NP) swabs in vial transport medium  Result Value Ref Range Status   SARS Coronavirus 2 by RT PCR POSITIVE (A) NEGATIVE Final    Comment: RESULT CALLED TO, READ BACK BY AND VERIFIED WITH: JANE RYAN 10/02/20 AT 1526 BY ACR (NOTE) SARS-CoV-2 target nucleic acids are DETECTED.  The SARS-CoV-2 RNA is generally detectable in upper respiratory specimens during the acute phase of infection. Positive results  are indicative of the presence of the identified virus, but do not rule out bacterial infection or co-infection with other pathogens not detected by the test. Clinical correlation with patient history and other diagnostic information is necessary to determine patient infection status. The expected result is Negative.  Fact Sheet for Patients: BloggerCourse.com  Fact Sheet for Healthcare Providers: SeriousBroker.it  This test is not yet approved or cleared by the [place] FDA and  has been authorized for detection and/or diagnosis of SARS-CoV-2 by FDA under an Emergency Use Authorization (EUA).  This EUA will remain in effect (meaning this test can be  used) for the duration of  the COVID-19 declaration under Section 564(b)(1) of the Act, 21 U.S.C. section 360bbb-3(b)(1), unless the authorization is terminated or revoked sooner.     Influenza A by PCR NEGATIVE NEGATIVE Final   Influenza B by PCR NEGATIVE NEGATIVE Final    Comment: (NOTE) The Xpert Xpress SARS-CoV-2/FLU/RSV plus assay is intended as an aid in the diagnosis of influenza from Nasopharyngeal swab specimens and should not be used as a sole basis for treatment. Nasal washings and aspirates are unacceptable for Xpert Xpress SARS-CoV-2/FLU/RSV testing.  Fact Sheet for Patients: BloggerCourse.com  Fact Sheet for Healthcare Providers: SeriousBroker.it  This test is not yet approved or cleared by the [place] FDA and has been authorized for detection and/or diagnosis of SARS-CoV-2 by FDA under an Emergency Use Authorization (EUA). This EUA will remain in effect (meaning this test can be used) for the duration of the COVID-19 declaration under Section 564(b)(1) of the Act, 21 U.S.C. section 360bbb-3(b)(1), unless the authorization is terminated or revoked.  Performed at [facility], [address] [place], [place] [address]          Radiology Studies: DG Chest 2 View  Result Date: 10/05/2020 CLINICAL DATA:  Chest pain, dyspnea. EXAM: CHEST - 2 VIEW COMPARISON:  None. FINDINGS: The heart size and mediastinal contours are within normal limits. No pneumothorax or pleural effusion is noted. Patchy bibasilar airspace opacities are noted concerning for multifocal pneumonia. The visualized skeletal structures are unremarkable. IMPRESSION: Patchy bibasilar pneumonia. Electronically Signed   By: Lupita Raider M.D.   On: 10/05/2020 15:54   CT ANGIO CHEST PE W OR WO CONTRAST  Result Date: 10/06/2020 CLINICAL DATA:  Chest pain and shortness of breath EXAM: CT ANGIOGRAPHY CHEST WITH CONTRAST TECHNIQUE: Multidetector CT imaging of the chest was performed using the standard protocol during bolus administration of intravenous contrast. Multiplanar CT image reconstructions and MIPs were obtained to evaluate the vascular anatomy. CONTRAST:  3mL OMNIPAQUE IOHEXOL 350 MG/ML SOLN COMPARISON:  None. FINDINGS: Cardiovascular:  There is a optimal opacification of the pulmonary arteries. There is no central,segmental, or subsegmental filling defects within the pulmonary arteries. The heart is normal in size. No pericardial effusion or thickening. No evidence right heart strain. There is normal three-vessel brachiocephalic anatomy without proximal stenosis. The thoracic aorta is normal in appearance. Mediastinum/Nodes: No hilar, mediastinal, or axillary adenopathy. Thyroid gland, trachea, and esophagus demonstrate no significant findings. Lungs/Pleura: Multifocal patchy airspace opacities are seen throughout both lungs, predominantly within the peripheries and lung bases. No pleural effusion or pneumothorax. Upper Abdomen: No acute abnormalities present in the visualized portions of the upper abdomen. Musculoskeletal: No chest wall abnormality. No acute or significant osseous findings. Review of the MIP images  confirms the above findings. IMPRESSION: No central, segmental, or subsegmental pulmonary embolism. Multifocal airspace opacities, consistent with atypical viral pneumonia Electronically Signed   By: Jonna Clark M.D.   On: 10/06/2020 00:23        Scheduled Meds: . albuterol  2 puff Inhalation Q6H  . vitamin C  500 mg Oral Daily  . enoxaparin (LOVENOX) injection  40 mg Subcutaneous Q24H  . methylPREDNISolone (SOLU-MEDROL) injection  0.5 mg/kg Intravenous Q12H   Followed by  . [START ON 10/09/2020] predniSONE  50 mg Oral Daily  . zinc sulfate  220 mg Oral Daily   Continuous Infusions: . remdesivir 100 mg in NS 100 mL Stopped (10/06/20 1145)     LOS: 1 day    Time spent: 38 minutes spent on chart review, discussion with nursing staff, consultants, updating family and interview/physical exam; more than 50% of that time was spent in counseling and/or coordination of care.    Alvira Philips [place], DO [facility] Available via Epic secure chat 7am-7pm After these hours, please refer to coverage provider listed on [email or web address] 10/06/2020, 12:20 PM

## 2020-10-06 NOTE — ED Notes (Signed)
Pt given meal tray at this time. Pt sat up in bed to eat at this time.

## 2020-10-06 NOTE — ED Notes (Signed)
Pt ate approx 50% of meal tray and 6 oz fluid.

## 2020-10-06 NOTE — ED Notes (Addendum)
This RN emptied trash cans in pt room and replaced with clean, empty trash bags at this time.

## 2020-10-06 NOTE — ED Notes (Signed)
Pt given lunch

## 2020-10-07 LAB — CBC WITH DIFFERENTIAL/PLATELET
Abs Immature Granulocytes: 0.13 10*3/uL — ABNORMAL HIGH (ref 0.00–0.07)
Basophils Absolute: 0 10*3/uL (ref 0.0–0.1)
Basophils Relative: 0 %
Eosinophils Absolute: 0 10*3/uL (ref 0.0–0.5)
Eosinophils Relative: 0 %
HCT: 37.5 % — ABNORMAL LOW (ref 39.0–52.0)
Hemoglobin: 12.9 g/dL — ABNORMAL LOW (ref 13.0–17.0)
Immature Granulocytes: 1 %
Lymphocytes Relative: 7 %
Lymphs Abs: 0.9 10*3/uL (ref 0.7–4.0)
MCH: 31.1 pg (ref 26.0–34.0)
MCHC: 34.4 g/dL (ref 30.0–36.0)
MCV: 90.4 fL (ref 80.0–100.0)
Monocytes Absolute: 0.7 10*3/uL (ref 0.1–1.0)
Monocytes Relative: 5 %
Neutro Abs: 12.3 10*3/uL — ABNORMAL HIGH (ref 1.7–7.7)
Neutrophils Relative %: 87 %
Platelets: 528 10*3/uL — ABNORMAL HIGH (ref 150–400)
RBC: 4.15 MIL/uL — ABNORMAL LOW (ref 4.22–5.81)
RDW: 12 % (ref 11.5–15.5)
WBC: 14.1 10*3/uL — ABNORMAL HIGH (ref 4.0–10.5)
nRBC: 0 % (ref 0.0–0.2)

## 2020-10-07 LAB — COMPREHENSIVE METABOLIC PANEL
ALT: 52 U/L — ABNORMAL HIGH (ref 0–44)
AST: 26 U/L (ref 15–41)
Albumin: 3.1 g/dL — ABNORMAL LOW (ref 3.5–5.0)
Alkaline Phosphatase: 50 U/L (ref 38–126)
Anion gap: 9 (ref 5–15)
BUN: 22 mg/dL — ABNORMAL HIGH (ref 6–20)
CO2: 24 mmol/L (ref 22–32)
Calcium: 8.9 mg/dL (ref 8.9–10.3)
Chloride: 106 mmol/L (ref 98–111)
Creatinine, Ser: 0.89 mg/dL (ref 0.61–1.24)
GFR, Estimated: >60 mL/min (ref >60)
Glucose, Bld: 171 mg/dL — ABNORMAL HIGH (ref 70–99)
Potassium: 4.6 mmol/L (ref 3.5–5.1)
Sodium: 139 mmol/L (ref 135–145)
Total Bilirubin: 0.7 mg/dL (ref 0.3–1.2)
Total Protein: 6.8 g/dL (ref 6.5–8.1)

## 2020-10-07 LAB — C-REACTIVE PROTEIN: CRP: 7 mg/dL — ABNORMAL HIGH (ref <1.0)

## 2020-10-07 LAB — FIBRIN DERIVATIVES D-DIMER (ARMC ONLY): Fibrin derivatives D-dimer (ARMC): 491.36 ng/mL (FEU) (ref 0.00–499.00)

## 2020-10-07 NOTE — Progress Notes (Signed)
PROGRESS NOTE    Kyle Wilson  ZOX:[id number] DOB: September 26, 1961 DOA: 10/05/2020 PCP: Oneita Hurt, No    Brief Narrative:  Kyle Wilson is a 60 year old male history significant for essential hypertension who presented to the ED via EMS with progressive cough and shortness of breath.  Patient reports associated weakness, poor oral intake, fevers and muscle aches.  On arrival, EMS noted oxygen saturation 83% on room air and requiring 6 L nasal cannula to maintain sats around 96%.  Patient was originally seen in the emergency department on 10/02/2020 and diagnosed with Covid-19 but he was not hypoxic and discharged home.  In the ED, temperature 99.9, BP 121/58, HR 73, RR 20, SPO2 97% on 6 L nasal cannula.  WBC 8.5, hemoglobin 14.3, platelets 402, sodium 140, potassium 3.7, chloride 104, CO2 22, glucose 117, BUN 18, creatinine 0.77.  Chest x-ray with patchy bibasilar pneumonia.  CT angiogram chest with no central/segmental/subsegmental pulmonary embolism but does note multifocal airspace opacities consistent with atypical viral pneumonia.  Hospital service consulted for further evaluation and management of acute hypoxic respiratory failure secondary to Covid-19 viral pneumonia.   Assessment & Plan:   Principal Problem:   Acute respiratory failure due to COVID-19 [facility] Active Problems:   HTN (hypertension)   Pneumonia due to COVID-19 virus   Acute hypoxic respiratory failure secondary to acute Covid-19 viral pneumonia during the ongoing 2020/2021 Covid 19 Pandemic - POA Patient presenting with progressive shortness of breath and nonproductive cough associated with weakness, fatigue, body aches, fever and poor oral intake.  Originally diagnosed with Covid-19 by positive PCR on 10/02/2020.  Was found to be hypoxic by EMS with 83% on room air.  Chest x-ray and CT angiogram chest consistent with multifocal pneumonia.  Unvaccinated against Covid-19. --COVID test: + PCR 10/02/2020 --CRP  12.6> pending this morning --ddimer [id number] --Remdesivir, plan 5-day course (Day #3/5) --Solumedrol 29.2mg  IV q12h x 3 days, followed by prednisone 50 mg p.o. daily --prone for 2-3hrs every 12hrs if able --Continue supplemental oxygen, titrate to maintain SPO2 greater than 92%, on 4 L nasal cannula with SPO2 96% --Continue supportive care with albuterol MDI prn, vitamin C, zinc, Tylenol, antitussives (benzonatate/ Mucinex/Tussionex) --Follow CBC, CMP, D-dimer, and CRP daily --Continue airborne/contact isolation precautions for 3 weeks from the day of diagnosis  The treatment plan and use of medications and known side effects were discussed with patient/family. Some of the medications used are based on case reports/anecdotal data.  All other medications being used in the management of COVID-19 based on limited study data.  Complete risks and long-term side effects are unknown, however in the best clinical judgment they seem to be of some benefit.  Patient wanted to proceed with treatment options provided.  Essential hypertension Home medications include amlodipine 10 mg p.o. daily --BP 126/75 --Hold home antihypertensives --Continue monitor blood pressure closely  Hyperlipidemia: Hold home simvastatin 20 mg p.o. daily   DVT prophylaxis: Lovenox Code Status: Full code Family Communication: Updated patient extensively at bedside  Disposition Plan:  Status is: Inpatient  Remains inpatient appropriate because:Unsafe d/c plan, IV treatments appropriate due to intensity of illness or inability to take PO and Inpatient level of care appropriate due to severity of illness   Dispo: The patient is from: Home              Anticipated d/c is to: Home              Anticipated d/c date is: > 3 days  Patient currently is not medically stable to d/c.   Consultants:   None  Procedures:   None  Antimicrobials:   None   Subjective: Patient seen and examined bedside, resting  comfortably in bed.  Video interpreter utilized during visit this morning.  Patient states overall shortness of breath improved.  Oxygen now titrated down from 6 L to 4 L nasal cannula this morning.  No other specific complaints or concerns at this time. Denies headache, no visual changes, no chest pain, no palpitations, no abdominal pain, no fever/chills/night sweats, no nausea/vomiting/diarrhea, no abdominal pain, no paresthesias.  No acute events overnight per nursing staff.  Objective: Vitals:   10/06/20 2127 10/06/20 2353 10/07/20 0515 10/07/20 0911  BP: 135/77 (!) 145/69 126/75 (!) 134/57  Pulse: 66 61 (!) 52 74  Resp: 16 18 18 20   Temp: 98.7 F (37.1 C) 98.2 F (36.8 C) 98.1 F (36.7 C) 98.1 F (36.7 C)  TempSrc: Oral Oral Oral Oral  SpO2: 97% 92% 99% 96%  Weight: 71 kg     Height: 5\' 4"  (1.626 m)       Intake/Output Summary (Last 24 hours) at 10/07/2020 1157 Last data filed at 10/06/2020 2200 Gross per 24 hour  Intake 240 ml  Output 700 ml  Net -460 ml   Filed Weights   10/05/20 1508 10/06/20 2127  Weight: 58 kg 71 kg    Examination:  General exam: Appears calm and comfortable  Respiratory system: Clear to auscultation. Respiratory effort normal.  On 4 L nasal cannula with SPO2 96% Cardiovascular system: S1 & S2 heard, RRR. No JVD, murmurs, rubs, gallops or clicks. No pedal edema. Gastrointestinal system: Abdomen is nondistended, soft and nontender. No organomegaly or masses felt. Normal bowel sounds heard. Central nervous system: Alert and oriented. No focal neurological deficits. Extremities: Symmetric 5 x 5 power. Skin: No rashes, lesions or ulcers Psychiatry: Judgement and insight appear normal. Mood & affect appropriate.     Data Reviewed: I have personally reviewed following labs and imaging studies  CBC: Recent Labs  Lab 10/05/20 1507 10/05/20 2100 10/06/20 0513 10/07/20 0428  WBC 8.5 8.2 5.0 14.1*  NEUTROABS  --   --  4.2 12.3*  HGB 14.3 13.0 13.1  12.9*  HCT 41.9 36.9* 37.5* 37.5*  MCV 89.5 88.7 89.7 90.4  PLT 402* 359 409* 623*   Basic Metabolic Panel: Recent Labs  Lab 10/05/20 1507 10/05/20 2100 10/06/20 0513 10/07/20 0428  NA 140  --  138 139  K 3.7  --  4.1 4.6  CL 104  --  106 106  CO2 22  --  23 24  GLUCOSE 117*  --  218* 171*  BUN 18  --  20 22*  CREATININE 0.77 0.76 0.86 0.89  CALCIUM 9.0  --  8.4* 8.9   GFR: Estimated Creatinine Clearance: 74.8 mL/min (by C-G formula based on SCr of 0.89 mg/dL). Liver Function Tests: Recent Labs  Lab 10/06/20 0513 10/07/20 0428  AST 36 26  ALT 51* 52*  ALKPHOS 53 50  BILITOT 0.8 0.7  PROT 6.7 6.8  ALBUMIN 3.0* 3.1*   No results for input(s): LIPASE, AMYLASE in the last 168 hours. No results for input(s): AMMONIA in the last 168 hours. Coagulation Profile: No results for input(s): INR, PROTIME in the last 168 hours. Cardiac Enzymes: No results for input(s): CKTOTAL, CKMB, CKMBINDEX, TROPONINI in the last 168 hours. BNP (last 3 results) No results for input(s): PROBNP in the last 8760 hours. HbA1C:  No results for input(s): HGBA1C in the last 72 hours. CBG: No results for input(s): GLUCAP in the last 168 hours. Lipid Profile: No results for input(s): CHOL, HDL, LDLCALC, TRIG, CHOLHDL, LDLDIRECT in the last 72 hours. Thyroid Function Tests: No results for input(s): TSH, T4TOTAL, FREET4, T3FREE, THYROIDAB in the last 72 hours. Anemia Panel: No results for input(s): VITAMINB12, FOLATE, FERRITIN, TIBC, IRON, RETICCTPCT in the last 72 hours. Sepsis Labs: No results for input(s): PROCALCITON, LATICACIDVEN in the last 168 hours.  Recent Results (from the past 240 hour(s))  Resp Panel by RT-PCR (Flu A&B, Covid) Nasopharyngeal Swab     Status: Abnormal   Collection Time: 10/02/20  2:32 PM   Specimen: Nasopharyngeal Swab; Nasopharyngeal(NP) swabs in vial transport medium  Result Value Ref Range Status   SARS Coronavirus 2 by RT PCR POSITIVE (A) NEGATIVE Final     Comment: RESULT CALLED TO, READ BACK BY AND VERIFIED WITH: JANE RYAN 10/02/20 AT 1526 BY ACR (NOTE) SARS-CoV-2 target nucleic acids are DETECTED.  The SARS-CoV-2 RNA is generally detectable in upper respiratory specimens during the acute phase of infection. Positive results are indicative of the presence of the identified virus, but do not rule out bacterial infection or co-infection with other pathogens not detected by the test. Clinical correlation with patient history and other diagnostic information is necessary to determine patient infection status. The expected result is Negative.  Fact Sheet for Patients: BloggerCourse.com  Fact Sheet for Healthcare Providers: SeriousBroker.it  This test is not yet approved or cleared by the [place] FDA and  has been authorized for detection and/or diagnosis of SARS-CoV-2 by FDA under an Emergency Use Authorization (EUA).  This EUA will remain in effect (meaning this test can be  used) for the duration of  the COVID-19 declaration under Section 564(b)(1) of the Act, 21 U.S.C. section 360bbb-3(b)(1), unless the authorization is terminated or revoked sooner.     Influenza A by PCR NEGATIVE NEGATIVE Final   Influenza B by PCR NEGATIVE NEGATIVE Final    Comment: (NOTE) The Xpert Xpress SARS-CoV-2/FLU/RSV plus assay is intended as an aid in the diagnosis of influenza from Nasopharyngeal swab specimens and should not be used as a sole basis for treatment. Nasal washings and aspirates are unacceptable for Xpert Xpress SARS-CoV-2/FLU/RSV testing.  Fact Sheet for Patients: BloggerCourse.com  Fact Sheet for Healthcare Providers: SeriousBroker.it  This test is not yet approved or cleared by the [place] FDA and has been authorized for detection and/or diagnosis of SARS-CoV-2 by FDA under an Emergency Use Authorization (EUA). This EUA  will remain in effect (meaning this test can be used) for the duration of the COVID-19 declaration under Section 564(b)(1) of the Act, 21 U.S.C. section 360bbb-3(b)(1), unless the authorization is terminated or revoked.  Performed at [facility], [address] [place], [place] [address]          Radiology Studies: DG Chest 2 View  Result Date: 10/05/2020 CLINICAL DATA:  Chest pain, dyspnea. EXAM: CHEST - 2 VIEW COMPARISON:  None. FINDINGS: The heart size and mediastinal contours are within normal limits. No pneumothorax or pleural effusion is noted. Patchy bibasilar airspace opacities are noted concerning for multifocal pneumonia. The visualized skeletal structures are unremarkable. IMPRESSION: Patchy bibasilar pneumonia. Electronically Signed   By: Lupita Raider M.D.   On: 10/05/2020 15:54   CT ANGIO CHEST PE W OR WO CONTRAST  Result Date: 10/06/2020 CLINICAL DATA:  Chest pain and shortness of breath EXAM: CT ANGIOGRAPHY  CHEST WITH CONTRAST TECHNIQUE: Multidetector CT imaging of the chest was performed using the standard protocol during bolus administration of intravenous contrast. Multiplanar CT image reconstructions and MIPs were obtained to evaluate the vascular anatomy. CONTRAST:  85mL OMNIPAQUE IOHEXOL 350 MG/ML SOLN COMPARISON:  None. FINDINGS: Cardiovascular: There is a optimal opacification of the pulmonary arteries. There is no central,segmental, or subsegmental filling defects within the pulmonary arteries. The heart is normal in size. No pericardial effusion or thickening. No evidence right heart strain. There is normal three-vessel brachiocephalic anatomy without proximal stenosis. The thoracic aorta is normal in appearance. Mediastinum/Nodes: No hilar, mediastinal, or axillary adenopathy. Thyroid gland, trachea, and esophagus demonstrate no significant findings. Lungs/Pleura: Multifocal patchy airspace opacities are seen throughout both lungs, predominantly within  the peripheries and lung bases. No pleural effusion or pneumothorax. Upper Abdomen: No acute abnormalities present in the visualized portions of the upper abdomen. Musculoskeletal: No chest wall abnormality. No acute or significant osseous findings. Review of the MIP images confirms the above findings. IMPRESSION: No central, segmental, or subsegmental pulmonary embolism. Multifocal airspace opacities, consistent with atypical viral pneumonia Electronically Signed   By: Jonna Clark M.D.   On: 10/06/2020 00:23        Scheduled Meds: . albuterol  2 puff Inhalation Q6H  . vitamin C  500 mg Oral Daily  . enoxaparin (LOVENOX) injection  40 mg Subcutaneous Q24H  . methylPREDNISolone (SOLU-MEDROL) injection  0.5 mg/kg Intravenous Q12H   Followed by  . [START ON 10/09/2020] predniSONE  50 mg Oral Daily  . zinc sulfate  220 mg Oral Daily   Continuous Infusions: . remdesivir 100 mg in NS 100 mL 100 mg (10/07/20 1031)     LOS: 2 days    Time spent: 38 minutes spent on chart review, discussion with nursing staff, consultants, updating family and interview/physical exam; more than 50% of that time was spent in counseling and/or coordination of care.    Alvira Philips [place], DO [facility] Available via Epic secure chat 7am-7pm After these hours, please refer to coverage provider listed on [email or web address] 10/07/2020, 11:57 AM

## 2020-10-07 NOTE — Plan of Care (Signed)
  Problem: Education: Goal: Knowledge of General Education information will improve Description Including pain rating scale, medication(s)/side effects and non-pharmacologic comfort measures Outcome: Progressing   

## 2020-10-08 DIAGNOSIS — I10 Essential (primary) hypertension: Secondary | ICD-10-CM

## 2020-10-08 DIAGNOSIS — J1282 Pneumonia due to coronavirus disease 2019: Secondary | ICD-10-CM

## 2020-10-08 LAB — COMPREHENSIVE METABOLIC PANEL
ALT: 91 U/L — ABNORMAL HIGH (ref 0–44)
AST: 47 U/L — ABNORMAL HIGH (ref 15–41)
Albumin: 2.9 g/dL — ABNORMAL LOW (ref 3.5–5.0)
Alkaline Phosphatase: 52 U/L (ref 38–126)
Anion gap: 10 (ref 5–15)
BUN: 20 mg/dL (ref 6–20)
CO2: 26 mmol/L (ref 22–32)
Calcium: 8.6 mg/dL — ABNORMAL LOW (ref 8.9–10.3)
Chloride: 105 mmol/L (ref 98–111)
Creatinine, Ser: 0.87 mg/dL (ref 0.61–1.24)
GFR, Estimated: >60 mL/min (ref >60)
Glucose, Bld: 152 mg/dL — ABNORMAL HIGH (ref 70–99)
Potassium: 4.5 mmol/L (ref 3.5–5.1)
Sodium: 141 mmol/L (ref 135–145)
Total Bilirubin: 0.5 mg/dL (ref 0.3–1.2)
Total Protein: 6.4 g/dL — ABNORMAL LOW (ref 6.5–8.1)

## 2020-10-08 LAB — CBC WITH DIFFERENTIAL/PLATELET
Abs Immature Granulocytes: 0.46 10*3/uL — ABNORMAL HIGH (ref 0.00–0.07)
Basophils Absolute: 0.1 10*3/uL (ref 0.0–0.1)
Basophils Relative: 0 %
Eosinophils Absolute: 0 10*3/uL (ref 0.0–0.5)
Eosinophils Relative: 0 %
HCT: 39.2 % (ref 39.0–52.0)
Hemoglobin: 13.5 g/dL (ref 13.0–17.0)
Immature Granulocytes: 3 %
Lymphocytes Relative: 7 %
Lymphs Abs: 1.1 10*3/uL (ref 0.7–4.0)
MCH: 30.6 pg (ref 26.0–34.0)
MCHC: 34.4 g/dL (ref 30.0–36.0)
MCV: 88.9 fL (ref 80.0–100.0)
Monocytes Absolute: 1.1 10*3/uL — ABNORMAL HIGH (ref 0.1–1.0)
Monocytes Relative: 7 %
Neutro Abs: 12.3 10*3/uL — ABNORMAL HIGH (ref 1.7–7.7)
Neutrophils Relative %: 83 %
Platelets: 569 10*3/uL — ABNORMAL HIGH (ref 150–400)
RBC: 4.41 MIL/uL (ref 4.22–5.81)
RDW: 12 % (ref 11.5–15.5)
WBC: 15 10*3/uL — ABNORMAL HIGH (ref 4.0–10.5)
nRBC: 0 % (ref 0.0–0.2)

## 2020-10-08 LAB — BRAIN NATRIURETIC PEPTIDE: B Natriuretic Peptide: 109.7 pg/mL — ABNORMAL HIGH (ref 0.0–100.0)

## 2020-10-08 LAB — C-REACTIVE PROTEIN: CRP: 2.9 mg/dL — ABNORMAL HIGH (ref <1.0)

## 2020-10-08 LAB — PROCALCITONIN: Procalcitonin: <0.1 ng/mL

## 2020-10-08 LAB — FIBRIN DERIVATIVES D-DIMER (ARMC ONLY): Fibrin derivatives D-dimer (ARMC): 508.37 ng/mL (FEU) — ABNORMAL HIGH (ref 0.00–499.00)

## 2020-10-08 NOTE — Progress Notes (Signed)
PROGRESS NOTE    Kyle Wilson  NLZ:[id number] DOB: July 08, 1961 DOA: 10/05/2020 PCP: Pcp, No   Chief complaint.  Shortness of breath. Brief Narrative:  Kyle Wilson is a 60 year old male history significant for essential hypertension who presented to the ED via EMS with progressive cough and shortness of breath.  Patient reports associated weakness, poor oral intake, fevers and muscle aches.  On arrival, EMS noted oxygen saturation 83% on room air and requiring 6 L nasal cannula to maintain sats around 96%.  He was first diagnosed with Covid on 10/02/2020.asymptomatic. Upon arriving the hospital, he was found to be hypoxemic, placed on 6 L oxygen.  CT angiogram showed no PE, but multifocal airspace disease consistent with Covid pneumonia.    Assessment & Plan:   Principal Problem:   Acute respiratory failure due to COVID-19 [facility]) Active Problems:   HTN (hypertension)   Pneumonia due to COVID-19 virus  #1. Acute hypoxemic respiratory failure secondary to Covid pneumonia. Multifocal pneumonia secondary to Covid infection. I have personally reviewed patient CT scan images, patient has diffuse pleural-based groundglass changes. Patient does not have volume overload, BNP 109, no secondary bacterial pneumonia, procalcitonin level less than 0.1. Patient currently treated with steroids and remdesivir. Oxygenation is improving, currently on 2.5 L. We will continue current treatment. He may be able to discharge in 1 to 2 days if he is able to be taking off oxygen.  #2 pure essential hypertension. Continue home medicines.    DVT prophylaxis: Lovenox Code Status: Full Family Communication: None Disposition Plan:  .   Status is: Inpatient  Remains inpatient appropriate because:Inpatient level of care appropriate due to severity of illness   Dispo:  Patient From: Home  Planned Disposition: Home  Expected discharge date: 10/09/2020  Medically stable for discharge:  No         I/O last 3 completed shifts: In: 240 [P.O.:240] Out: 200 [Urine:200] No intake/output data recorded.     Consultants:   None  Procedures: None  Antimicrobials: None  Subjective: Seen patient with the help from virtual Spanish interpreter. He said he is doing much better today.  No segment short of breath.  He was able to walk to the bathroom and back without short of breath or drop oxygen saturation. Cough, with small amount of clear mucus. No diarrhea or abdominal pain. No fever or chills. No dysuria or hematuria pain No headache or dizziness. No chest pain or palpitation.  Objective: Vitals:   10/07/20 2351 10/08/20 0534 10/08/20 0759 10/08/20 0759  BP: (!) 156/87 (!) 143/86  (!) 143/87  Pulse: 65 (!) 57  (!) 55  Resp: 18 20  18   Temp: 98.7 F (37.1 C) 97.9 F (36.6 C)  98.6 F (37 C)  TempSrc:    Oral  SpO2: 98% 98% 97% 98%  Weight:      Height:       No intake or output data in the 24 hours ending 10/08/20 0950 Filed Weights   10/05/20 1508 10/06/20 2127  Weight: 58 kg 71 kg    Examination:  General exam: Appears calm and comfortable  Respiratory system: Clear to auscultation. Respiratory effort normal. Cardiovascular system: S1 & S2 heard, RRR. No JVD, murmurs, rubs, gallops or clicks. No pedal edema. Gastrointestinal system: Abdomen is nondistended, soft and nontender. No organomegaly or masses felt. Normal bowel sounds heard. Central nervous system: Alert and oriented x3. No focal neurological deficits. Extremities: Symmetric 5 x 5 power. Skin: No rashes, lesions or  ulcers Psychiatry:  Mood & affect appropriate.     Data Reviewed: I have personally reviewed following labs and imaging studies  CBC: Recent Labs  Lab 10/05/20 1507 10/05/20 2100 10/06/20 0513 10/07/20 0428 10/08/20 0601  WBC 8.5 8.2 5.0 14.1* 15.0*  NEUTROABS  --   --  4.2 12.3* 12.3*  HGB 14.3 13.0 13.1 12.9* 13.5  HCT 41.9 36.9* 37.5* 37.5* 39.2  MCV  89.5 88.7 89.7 90.4 88.9  PLT 402* 359 409* 528* 569*   Basic Metabolic Panel: Recent Labs  Lab 10/05/20 1507 10/05/20 2100 10/06/20 0513 10/07/20 0428 10/08/20 0601  NA 140  --  138 139 141  K 3.7  --  4.1 4.6 4.5  CL 104  --  106 106 105  CO2 22  --  23 24 26   GLUCOSE 117*  --  218* 171* 152*  BUN 18  --  20 22* 20  CREATININE 0.77 0.76 0.86 0.89 0.87  CALCIUM 9.0  --  8.4* 8.9 8.6*   GFR: Estimated Creatinine Clearance: 76.6 mL/min (by C-G formula based on SCr of 0.87 mg/dL). Liver Function Tests: Recent Labs  Lab 10/06/20 0513 10/07/20 0428 10/08/20 0601  AST 36 26 47*  ALT 51* 52* 91*  ALKPHOS 53 50 52  BILITOT 0.8 0.7 0.5  PROT 6.7 6.8 6.4*  ALBUMIN 3.0* 3.1* 2.9*   No results for input(s): LIPASE, AMYLASE in the last 168 hours. No results for input(s): AMMONIA in the last 168 hours. Coagulation Profile: No results for input(s): INR, PROTIME in the last 168 hours. Cardiac Enzymes: No results for input(s): CKTOTAL, CKMB, CKMBINDEX, TROPONINI in the last 168 hours. BNP (last 3 results) No results for input(s): PROBNP in the last 8760 hours. HbA1C: No results for input(s): HGBA1C in the last 72 hours. CBG: No results for input(s): GLUCAP in the last 168 hours. Lipid Profile: No results for input(s): CHOL, HDL, LDLCALC, TRIG, CHOLHDL, LDLDIRECT in the last 72 hours. Thyroid Function Tests: No results for input(s): TSH, T4TOTAL, FREET4, T3FREE, THYROIDAB in the last 72 hours. Anemia Panel: No results for input(s): VITAMINB12, FOLATE, FERRITIN, TIBC, IRON, RETICCTPCT in the last 72 hours. Sepsis Labs: Recent Labs  Lab 10/08/20 0601  PROCALCITON <0.10    Recent Results (from the past 240 hour(s))  Resp Panel by RT-PCR (Flu A&B, Covid) Nasopharyngeal Swab     Status: Abnormal   Collection Time: 10/02/20  2:32 PM   Specimen: Nasopharyngeal Swab; Nasopharyngeal(NP) swabs in vial transport medium  Result Value Ref Range Status   SARS Coronavirus 2 by RT PCR  POSITIVE (A) NEGATIVE Final    Comment: RESULT CALLED TO, READ BACK BY AND VERIFIED WITH: JANE RYAN 10/02/20 AT 1526 BY ACR (NOTE) SARS-CoV-2 target nucleic acids are DETECTED.  The SARS-CoV-2 RNA is generally detectable in upper respiratory specimens during the acute phase of infection. Positive results are indicative of the presence of the identified virus, but do not rule out bacterial infection or co-infection with other pathogens not detected by the test. Clinical correlation with patient history and other diagnostic information is necessary to determine patient infection status. The expected result is Negative.  Fact Sheet for Patients: 10/04/20  Fact Sheet for Healthcare Providers: BloggerCourse.com  This test is not yet approved or cleared by the SeriousBroker.it FDA and  has been authorized for detection and/or diagnosis of SARS-CoV-2 by FDA under an Emergency Use Authorization (EUA).  This EUA will remain in effect (meaning this test can be  used)  for the duration of  the COVID-19 declaration under Section 564(b)(1) of the Act, 21 U.S.C. section 360bbb-3(b)(1), unless the authorization is terminated or revoked sooner.     Influenza A by PCR NEGATIVE NEGATIVE Final   Influenza B by PCR NEGATIVE NEGATIVE Final    Comment: (NOTE) The Xpert Xpress SARS-CoV-2/FLU/RSV plus assay is intended as an aid in the diagnosis of influenza from Nasopharyngeal swab specimens and should not be used as a sole basis for treatment. Nasal washings and aspirates are unacceptable for Xpert Xpress SARS-CoV-2/FLU/RSV testing.  Fact Sheet for Patients: EntrepreneurPulse.com.au  Fact Sheet for Healthcare Providers: IncredibleEmployment.be  This test is not yet approved or cleared by the [place] FDA and has been authorized for detection and/or diagnosis of SARS-CoV-2 by FDA under an Emergency Use  Authorization (EUA). This EUA will remain in effect (meaning this test can be used) for the duration of the COVID-19 declaration under Section 564(b)(1) of the Act, 21 U.S.C. section 360bbb-3(b)(1), unless the authorization is terminated or revoked.  Performed at [facility], [address], [place], [place] [address]          Radiology Studies: No results found.      Scheduled Meds: . albuterol  2 puff Inhalation Q6H  . vitamin C  500 mg Oral Daily  . enoxaparin (LOVENOX) injection  40 mg Subcutaneous Q24H  . methylPREDNISolone (SOLU-MEDROL) injection  0.5 mg/kg Intravenous Q12H   Followed by  . [START ON 10/09/2020] predniSONE  50 mg Oral Daily  . zinc sulfate  220 mg Oral Daily   Continuous Infusions: . remdesivir 100 mg in NS 100 mL 100 mg (10/07/20 1031)     LOS: 3 days    Time spent: 28 minutes    Sharen Hones, MD [facility]   To contact the attending provider between 7A-7P or the covering provider during after hours 7P-7A, please log into the web site [email or web address] and access using universal [place] password for that web site. If you do not have the password, please call the hospital operator.  10/08/2020, 9:50 AM

## 2020-10-08 NOTE — Progress Notes (Signed)
I spoke with the DIL, Kyle Wilson with updates on the patient. A contact # for Kyle Wilson or the patients wife (whom both live in [place]) Kyle Wilson is [phone]

## 2020-10-09 LAB — CBC WITH DIFFERENTIAL/PLATELET
Abs Immature Granulocytes: 0.92 10*3/uL — ABNORMAL HIGH (ref 0.00–0.07)
Basophils Absolute: 0.1 10*3/uL (ref 0.0–0.1)
Basophils Relative: 1 %
Eosinophils Absolute: 0.2 10*3/uL (ref 0.0–0.5)
Eosinophils Relative: 1 %
HCT: 39.4 % (ref 39.0–52.0)
Hemoglobin: 13.6 g/dL (ref 13.0–17.0)
Immature Granulocytes: 7 %
Lymphocytes Relative: 15 %
Lymphs Abs: 2 10*3/uL (ref 0.7–4.0)
MCH: 30.8 pg (ref 26.0–34.0)
MCHC: 34.5 g/dL (ref 30.0–36.0)
MCV: 89.3 fL (ref 80.0–100.0)
Monocytes Absolute: 1.3 10*3/uL — ABNORMAL HIGH (ref 0.1–1.0)
Monocytes Relative: 10 %
Neutro Abs: 9.1 10*3/uL — ABNORMAL HIGH (ref 1.7–7.7)
Neutrophils Relative %: 66 %
Platelets: 534 10*3/uL — ABNORMAL HIGH (ref 150–400)
RBC: 4.41 MIL/uL (ref 4.22–5.81)
RDW: 12.2 % (ref 11.5–15.5)
Smear Review: NORMAL
WBC: 13.5 10*3/uL — ABNORMAL HIGH (ref 4.0–10.5)
nRBC: 0.1 % (ref 0.0–0.2)

## 2020-10-09 LAB — COMPREHENSIVE METABOLIC PANEL
ALT: 100 U/L — ABNORMAL HIGH (ref 0–44)
AST: 30 U/L (ref 15–41)
Albumin: 2.7 g/dL — ABNORMAL LOW (ref 3.5–5.0)
Alkaline Phosphatase: 48 U/L (ref 38–126)
Anion gap: 7 (ref 5–15)
BUN: 23 mg/dL — ABNORMAL HIGH (ref 6–20)
CO2: 26 mmol/L (ref 22–32)
Calcium: 8.2 mg/dL — ABNORMAL LOW (ref 8.9–10.3)
Chloride: 108 mmol/L (ref 98–111)
Creatinine, Ser: 0.85 mg/dL (ref 0.61–1.24)
GFR, Estimated: >60 mL/min (ref >60)
Glucose, Bld: 95 mg/dL (ref 70–99)
Potassium: 4.7 mmol/L (ref 3.5–5.1)
Sodium: 141 mmol/L (ref 135–145)
Total Bilirubin: 0.5 mg/dL (ref 0.3–1.2)
Total Protein: 5.8 g/dL — ABNORMAL LOW (ref 6.5–8.1)

## 2020-10-09 LAB — C-REACTIVE PROTEIN: CRP: 1.3 mg/dL — ABNORMAL HIGH (ref <1.0)

## 2020-10-09 LAB — FIBRIN DERIVATIVES D-DIMER (ARMC ONLY): Fibrin derivatives D-dimer (ARMC): 626.13 ng/mL (FEU) — ABNORMAL HIGH (ref 0.00–499.00)

## 2020-10-09 MED ORDER — ZINC SULFATE 220 (50 ZN) MG PO CAPS: 220.0000 mg | ORAL_CAPSULE | Freq: Every day | ORAL | 0 refills | Status: AC

## 2020-10-09 MED ORDER — ASCORBIC ACID 500 MG PO TABS: 500.0000 mg | ORAL_TABLET | Freq: Every day | ORAL | 0 refills | Status: AC

## 2020-10-09 MED ORDER — DEXAMETHASONE 6 MG PO TABS: 6.0000 mg | ORAL_TABLET | Freq: Two times a day (BID) | ORAL | 0 refills | Status: AC

## 2020-10-09 NOTE — TOC Progression Note (Signed)
Transition of Care [facility]) - Progression Note    Patient Details  Name: Isaish Alemu MRN: [id number] Date of Birth: 17-Sep-1961  Transition of Care [facility] CM/SW Contact  Allayne Butcher, RN Phone Number: 10/09/2020, 3:45 PM  Clinical Narrative:    Patient will stay in his hospital room until ready to be picked up early in the morning to go to the [employer].  RN staff tonight will help patient get [facility]oad set up.    Expected Discharge Plan: Home/Self Care Barriers to Discharge: Barriers Resolved  Expected Discharge Plan and Services Expected Discharge Plan: Home/Self Care   Discharge Planning Services: CM Consult   Living arrangements for the past 2 months: Single Family Home Expected Discharge Date: 10/09/20               DME Arranged: Oxygen DME Agency: [employer] Date DME Agency Contacted: 10/09/20 Time DME Agency Contacted: 1200 Representative spoke with at DME Agency: Ian Malkin HH Arranged: NA           Social Determinants of Health (SDOH) Interventions    Readmission Risk Interventions No flowsheet data found.

## 2020-10-09 NOTE — Discharge Instructions (Signed)
Follow up with PCP within 1 week.

## 2020-10-09 NOTE — Progress Notes (Signed)
SATURATION QUALIFICATIONS: (This note is used to comply with regulatory documentation for home oxygen)  Patient Saturations on Room Air at Rest = 88%  Patient Saturations on Room Air while Ambulating = 81%  Patient Saturations on 1 Liters of oxygen while Ambulating = 89%  Please briefly explain why patient needs home oxygen:

## 2020-10-09 NOTE — TOC Initial Note (Signed)
Transition of Care [facility]) - Initial/Assessment Note    Patient Details  Name: Kyle Wilson MRN: [id number] Date of Birth: 1961-09-12  Transition of Care [facility]) CM/SW Contact:    Allayne Butcher, RN Phone Number: 10/09/2020, 11:45 AM  Clinical Narrative:                 Patient admitted to the hospital with COVID.  Patient speaks Spanish and is from [place].  RNCM spoke with patient's wife via phone, Darral Dash, and she requests that the hospital find the patient somewhere to stay until she can get him a bus ticket tomorrow at [employer] station.  Patient is down here visiting his daughter, Alcario Drought, in [place].  Erica's number per wife is [phone]- RNCM attempted to call but went straight to voicemail.  Message left for daughter to return call.  Patient's luggage is at his daughter's home so he needs to find someone to bring it to the hospital for him.  Patient's wife does not want him to go back to the daughter's, she is worried about COVID germs and says that it's not good for him to stay there.   [facility] only offers hotel vouchers for homeless patient's and patient is not homeless so we are unable to offer housing.  RNCM spoke with patient using the Spanish interpreter and told him that if he wants to stay in a hotel he will have to pay for it.  Patient verbalizes understanding.    Patient will try and reach his daughter to get his belongings here then he will call his wife to arrange a hotel and have his wife call the nurse.    Expected Discharge Plan: Home/Self Care Barriers to Discharge: Family Issues   Patient Goals and CMS Choice Patient states their goals for this hospitalization and ongoing recovery are:: To get back to [place] where he lives      Expected Discharge Plan and Services Expected Discharge Plan: Home/Self Care   Discharge Planning Services: CM Consult   Living arrangements for the past 2 months: Single Family Home Expected Discharge Date: 10/09/20               DME  Arranged: N/A DME Agency: NA       HH Arranged: NA          Prior Living Arrangements/Services Living arrangements for the past 2 months: Single Family Home Lives with:: Spouse Patient language and need for interpreter reviewed:: Yes (Spanish) Do you feel safe going back to the place where you live?: Yes      Need for Family Participation in Patient Care: Yes (Comment) (COVID) Care giver support system in place?: Yes (comment) (wife)   Criminal Activity/Legal Involvement Pertinent to Current Situation/Hospitalization: No - Comment as needed  Activities of Daily Living Home Assistive Devices/Equipment: None ADL Screening (condition at time of admission) Patient's cognitive ability adequate to safely complete daily activities?: Yes Is the patient deaf or have difficulty hearing?: No Does the patient have difficulty seeing, even when wearing glasses/contacts?: No Does the patient have difficulty concentrating, remembering, or making decisions?: No Patient able to express need for assistance with ADLs?: No Does the patient have difficulty dressing or bathing?: No Independently performs ADLs?: Yes (appropriate for developmental age) Does the patient have difficulty walking or climbing stairs?: No Weakness of Legs: None Weakness of Arms/Hands: None  Permission Sought/Granted Permission sought to share information with : Case Manager,Family Supports Permission granted to share information with : Yes, Verbal  Permission Granted  Share Information with NAME: Darral Dash     Permission granted to share info w Relationship: wife     Emotional Assessment   Attitude/Demeanor/Rapport: Engaged Affect (typically observed): Accepting Orientation: : Oriented to Self,Oriented to Place,Oriented to  Time,Oriented to Situation Alcohol / Substance Use: Not Applicable Psych Involvement: No (comment)  Admission diagnosis:  Hypoxia [R09.02] Acute respiratory failure due to COVID-19 (HCC) [U07.1,  J96.00] COVID-19 [U07.1] Patient Active Problem List   Diagnosis Date Noted  . HTN (hypertension) 10/05/2020  . Pneumonia due to COVID-19 virus 10/05/2020  . Acute respiratory failure due to COVID-19 (HCC) 10/05/2020   PCP:  Pcp, No Pharmacy:   [id number] - [place], [place] - [address] [place] [address] Phone: [phone] Fax: [phone]     Social Determinants of Health (SDOH) Interventions    Readmission Risk Interventions No flowsheet data found.

## 2020-10-09 NOTE — TOC Transition Note (Signed)
Transition of Care [facility]) - CM/SW Discharge Note   Patient Details  Name: Kyle Wilson MRN: [id number] Date of Birth: [birth year]  Transition [facility]) CM/SW Contact:  Allayne Butcher, RN Phone Number: 10/09/2020, 3:27 PM   Clinical Narrative:    Patient will discharge this afternoon.  Current plan is for patient to discharge to the ER lobby and call an Benedetto Goad later tonight to take him to the [employer] station at [employer].  His ticket is for 0330 in the morning.  Patient and wife both aware of this plan and agree.  Patient provided oxygen from [employer] with Letter of Guarantee through [facility] department.  Oxygen has been delivered and pulse ox provided to patient by [facility] team.     Final next level of care: Home/Self Care Barriers to Discharge: Barriers Resolved   Patient Goals and CMS Choice Patient states their goals for this hospitalization and ongoing recovery are:: To get back to [place] where he lives      Discharge Placement                       Discharge Plan and Services   Discharge Planning Services: CM Consult            DME Arranged: Oxygen DME Agency: [employer] Date DME Agency Contacted: 10/09/20 Time DME Agency Contacted: 1200 Representative spoke with at DME Agency: Ian Malkin HH Arranged: NA          Social Determinants of Health (SDOH) Interventions     Readmission Risk Interventions No flowsheet data found.

## 2020-10-09 NOTE — Discharge Summary (Signed)
Physician Discharge Summary  Patient ID: Kyle Wilson MRN: [id number] DOB/AGE: 60-Aug-1962 9 y.o.  Admit date: 10/05/2020 Discharge date: 10/09/2020  Admission Diagnoses:  Discharge Diagnoses:  Principal Problem:   Acute respiratory failure due to COVID-19 [facility]) Active Problems:   HTN (hypertension)   Pneumonia due to COVID-19 virus   Discharged Condition: good  Hospital Course:  Kyle Varnell Ginoriis a 59 year old male history significant for essential hypertension who presented to the ED via EMS with progressive cough and shortness of breath. Patient reports associated weakness, poor oral intake, fevers and muscle aches. On arrival, EMS noted oxygen saturation 83% on room air and requiring 6 L nasal cannula to maintain sats around 96%.  He was first diagnosed with Covid on 10/02/2020.asymptomatic. Upon arriving the hospital, he was found to be hypoxemic, placed on 6 L oxygen.  CT angiogram showed no PE, but multifocal airspace disease consistent with Covid pneumonia.   #1. Acute hypoxemic respiratory failure secondary to Covid pneumonia. Multifocal pneumonia secondary to Covid infection. I have personally reviewed patient CT scan images, patient has diffuse pleural-based groundglass changes. Patient does not have volume overload, BNP 109, no secondary bacterial pneumonia, procalcitonin level less than 0.1. Patient has completed remdesivir.  Condition had improved, currently on 1 L oxygen, saturation 96%.  Will obtain home oxygen evaluation. Continue dexamethasone for additional 6 days. Spoke with the patient wife, patient is from [place],.  Family will figure out how to transport patient back to [place].  Consults: None  Significant Diagnostic Studies: CT ANGIOGRAPHY CHEST WITH CONTRAST  TECHNIQUE: Multidetector CT imaging of the chest was performed using the standard protocol during bolus administration of intravenous contrast. Multiplanar CT image  reconstructions and MIPs were obtained to evaluate the vascular anatomy.  CONTRAST:  68mL OMNIPAQUE IOHEXOL 350 MG/ML SOLN  COMPARISON:  None.  FINDINGS: Cardiovascular: There is a optimal opacification of the pulmonary arteries. There is no central,segmental, or subsegmental filling defects within the pulmonary arteries. The heart is normal in size. No pericardial effusion or thickening. No evidence right heart strain. There is normal three-vessel brachiocephalic anatomy without proximal stenosis. The thoracic aorta is normal in appearance.  Mediastinum/Nodes: No hilar, mediastinal, or axillary adenopathy. Thyroid gland, trachea, and esophagus demonstrate no significant findings.  Lungs/Pleura: Multifocal patchy airspace opacities are seen throughout both lungs, predominantly within the peripheries and lung bases. No pleural effusion or pneumothorax.  Upper Abdomen: No acute abnormalities present in the visualized portions of the upper abdomen.  Musculoskeletal: No chest wall abnormality. No acute or significant osseous findings.  Review of the MIP images confirms the above findings.  IMPRESSION: No central, segmental, or subsegmental pulmonary embolism.  Multifocal airspace opacities, consistent with atypical viral pneumonia   Electronically Signed   By: Jonna Clark M.D.   On: 10/06/2020 00:23     Treatments: Steroids, remdesivir  Discharge Exam: Blood pressure 127/74, pulse (!) 52, temperature 98.1 F (36.7 C), resp. rate 18, height 5\' 4"  (1.626 m), weight 71 kg, SpO2 96 %. General appearance: alert and cooperative Resp: clear to auscultation bilaterally Cardio: regular rate and rhythm, S1, S2 normal, no murmur, click, rub or gallop GI: soft, non-tender; bowel sounds normal; no masses,  no organomegaly Extremities: extremities normal, atraumatic, no cyanosis or edema  Disposition: Discharge disposition: 01-Home or Self  Care       Discharge Instructions    Diet - low sodium heart healthy   Complete by: As directed    Increase activity slowly   Complete  by: As directed      Allergies as of 10/09/2020   No Known Allergies     Medication List    TAKE these medications   albuterol 108 (90 Base) MCG/ACT inhaler Commonly known as: VENTOLIN HFA Inhale 2 puffs into the lungs every 6 (six) hours as needed for wheezing or shortness of breath.   amLODipine 10 MG tablet Commonly known as: NORVASC Take 10 mg by mouth daily.   ascorbic acid 500 MG tablet Commonly known as: VITAMIN C Take 1 tablet (500 mg total) by mouth daily for 14 days. Start taking on: October 10, 2020   benzonatate 100 MG capsule Commonly known as: Lawyer Take 1 capsule (100 mg total) by mouth 3 (three) times daily as needed for up to 7 days for cough.   dexamethasone 6 MG tablet Commonly known as: DECADRON Take 1 tablet (6 mg total) by mouth 2 (two) times daily with a meal for 7 days.   dextromethorphan-guaiFENesin 30-600 MG 12hr tablet Commonly known as: MUCINEX DM Take 1 tablet by mouth 2 (two) times daily.   simvastatin 20 MG tablet Commonly known as: ZOCOR Take 20 mg by mouth at bedtime.   zinc sulfate 220 (50 Zn) MG capsule Take 1 capsule (220 mg total) by mouth daily for 14 days. Start taking on: October 10, 2020      34 minutes  Signed: Marrion Coy 10/09/2020, 10:12 AM

## 2020-10-10 LAB — CBC WITH DIFFERENTIAL/PLATELET
Abs Immature Granulocytes: 0.69 10*3/uL — ABNORMAL HIGH (ref 0.00–0.07)
Basophils Absolute: 0 10*3/uL (ref 0.0–0.1)
Basophils Relative: 0 %
Eosinophils Absolute: 0.1 10*3/uL (ref 0.0–0.5)
Eosinophils Relative: 1 %
HCT: 39.6 % (ref 39.0–52.0)
Hemoglobin: 13.3 g/dL (ref 13.0–17.0)
Immature Granulocytes: 6 %
Lymphocytes Relative: 16 %
Lymphs Abs: 1.9 10*3/uL (ref 0.7–4.0)
MCH: 30.8 pg (ref 26.0–34.0)
MCHC: 33.6 g/dL (ref 30.0–36.0)
MCV: 91.7 fL (ref 80.0–100.0)
Monocytes Absolute: 1.3 10*3/uL — ABNORMAL HIGH (ref 0.1–1.0)
Monocytes Relative: 11 %
Neutro Abs: 7.9 10*3/uL — ABNORMAL HIGH (ref 1.7–7.7)
Neutrophils Relative %: 66 %
Platelets: 578 10*3/uL — ABNORMAL HIGH (ref 150–400)
RBC: 4.32 MIL/uL (ref 4.22–5.81)
RDW: 12.4 % (ref 11.5–15.5)
Smear Review: NORMAL
WBC: 12 10*3/uL — ABNORMAL HIGH (ref 4.0–10.5)
nRBC: 0.4 % — ABNORMAL HIGH (ref 0.0–0.2)

## 2020-10-10 LAB — FIBRIN DERIVATIVES D-DIMER (ARMC ONLY): Fibrin derivatives D-dimer (ARMC): 624.56 ng/mL (FEU) — ABNORMAL HIGH (ref 0.00–499.00)

## 2020-10-10 LAB — C-REACTIVE PROTEIN: CRP: 2.9 mg/dL — ABNORMAL HIGH (ref <1.0)

## 2020-10-10 LAB — COMPREHENSIVE METABOLIC PANEL
ALT: 78 U/L — ABNORMAL HIGH (ref 0–44)
AST: 18 U/L (ref 15–41)
Albumin: 2.9 g/dL — ABNORMAL LOW (ref 3.5–5.0)
Alkaline Phosphatase: 54 U/L (ref 38–126)
Anion gap: 8 (ref 5–15)
BUN: 26 mg/dL — ABNORMAL HIGH (ref 6–20)
CO2: 28 mmol/L (ref 22–32)
Calcium: 8.3 mg/dL — ABNORMAL LOW (ref 8.9–10.3)
Chloride: 107 mmol/L (ref 98–111)
Creatinine, Ser: 0.96 mg/dL (ref 0.61–1.24)
GFR, Estimated: >60 mL/min (ref >60)
Glucose, Bld: 134 mg/dL — ABNORMAL HIGH (ref 70–99)
Potassium: 4.3 mmol/L (ref 3.5–5.1)
Sodium: 143 mmol/L (ref 135–145)
Total Bilirubin: 0.7 mg/dL (ref 0.3–1.2)
Total Protein: 6.1 g/dL — ABNORMAL LOW (ref 6.5–8.1)

## 2021-12-07 IMAGING — CT CT ANGIO CHEST
2 of 6 series · 19 of 46 positions shown · IV contrast (APPLIED)
Comparison: None.

CLINICAL DATA: Chest pain and shortness of breath

EXAM:
CT ANGIOGRAPHY CHEST WITH CONTRAST
TECHNIQUE: Multidetector CT imaging of the chest was performed using the
standard protocol during bolus administration of intravenous
contrast. Multiplanar CT image reconstructions and MIPs were
obtained to evaluate the vascular anatomy.
CONTRAST:  75mL OMNIPAQUE IOHEXOL 350 MG/ML SOLN

[Series 5: thins · axial · 0.61mm/px · z∈[-496,-248]mm · 17 of 272 slices shown]
[im 12/272  lung]
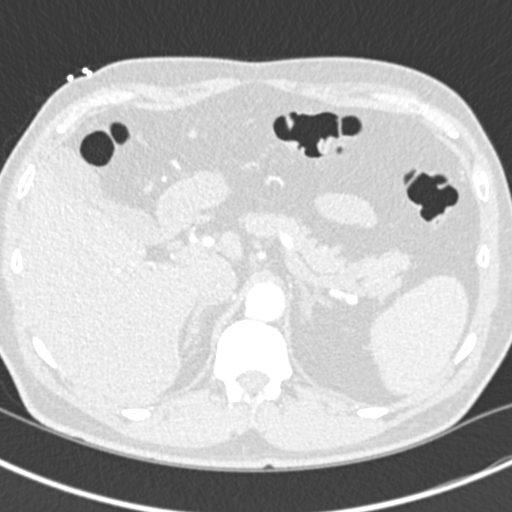
[im 24/272  soft-tissue]
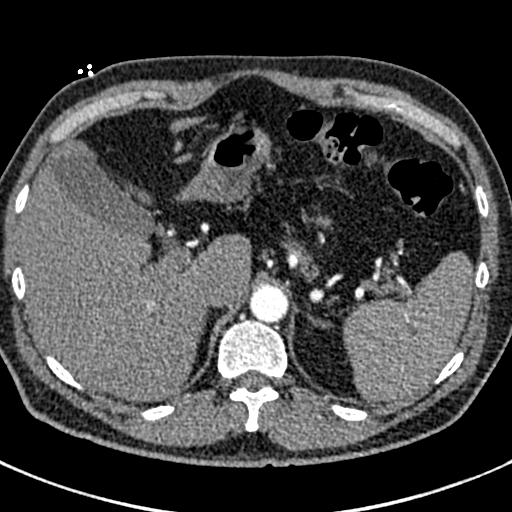
[im 48/272  lung]
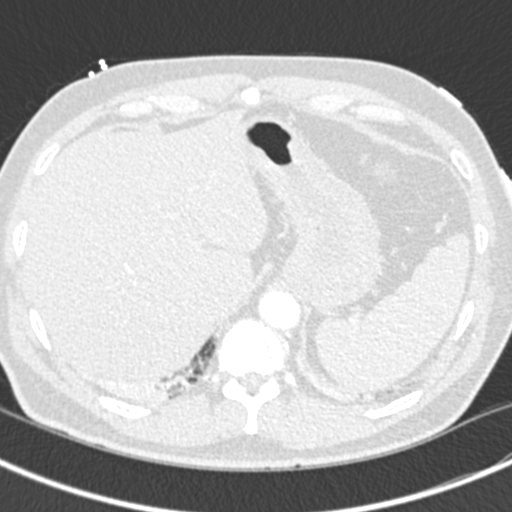
[im 59/272  soft-tissue]
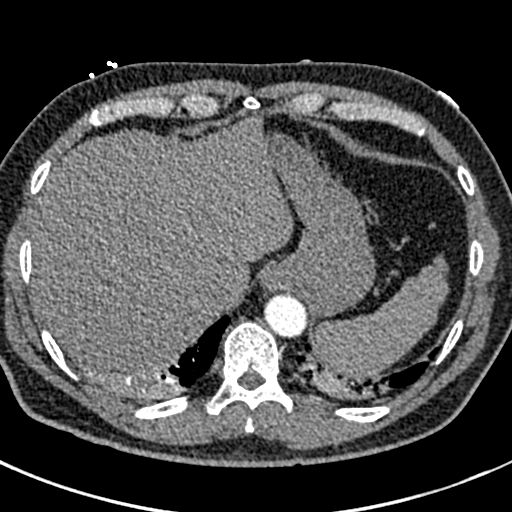
[im 71/272  lung]
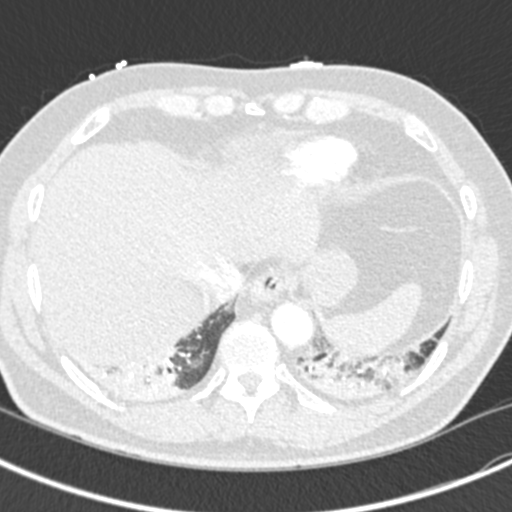
[im 95/272  soft-tissue]
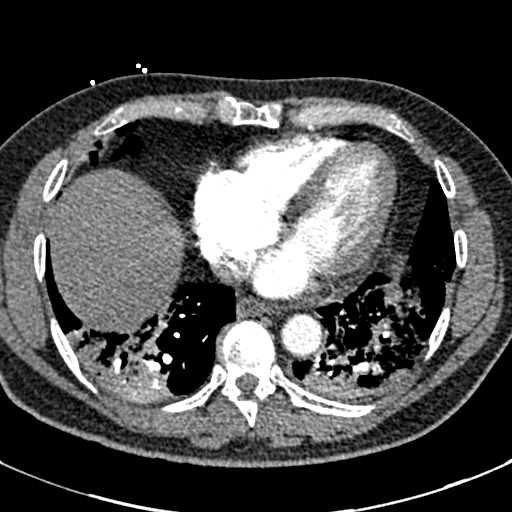
[im 107/272  lung]
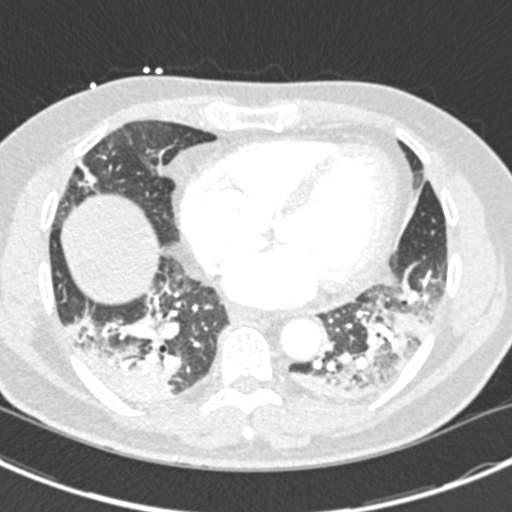
[im 118/272  soft-tissue]
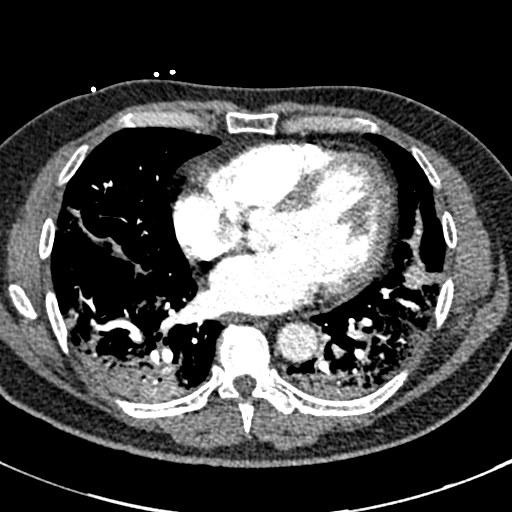
[im 142/272  lung]
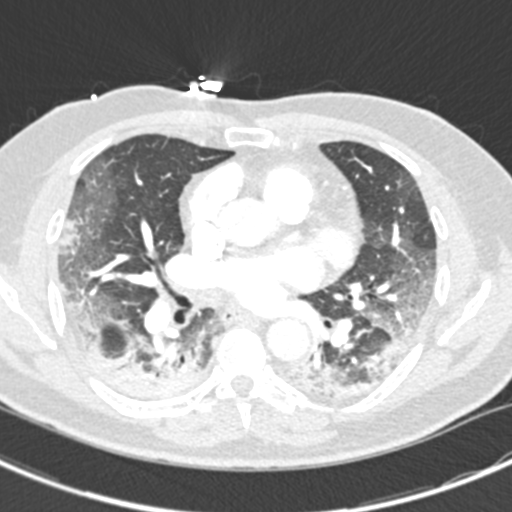
[im 154/272  soft-tissue]
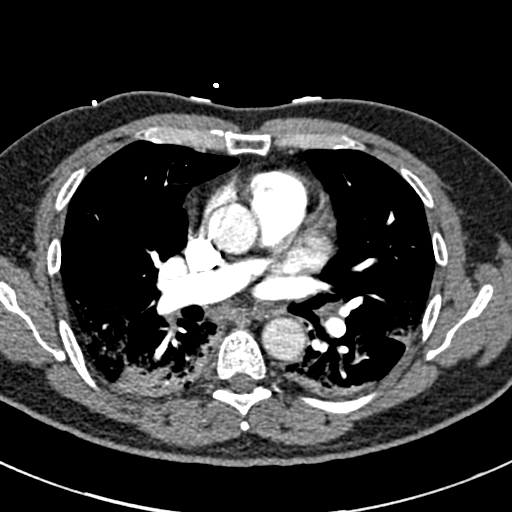
[im 165/272  lung]
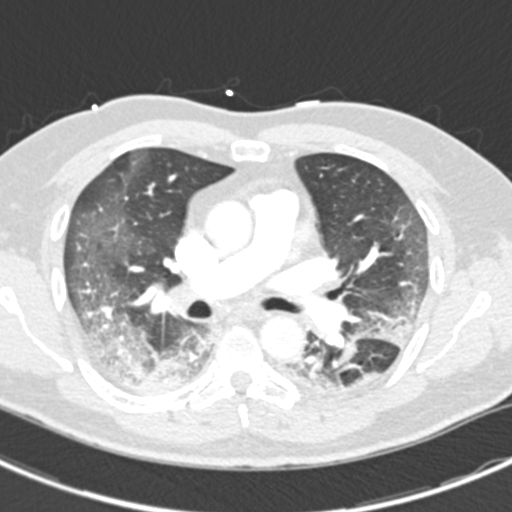
[im 177/272  soft-tissue]
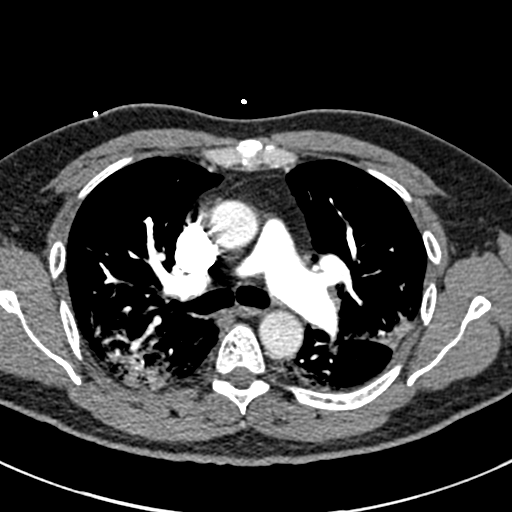
[im 201/272  lung]
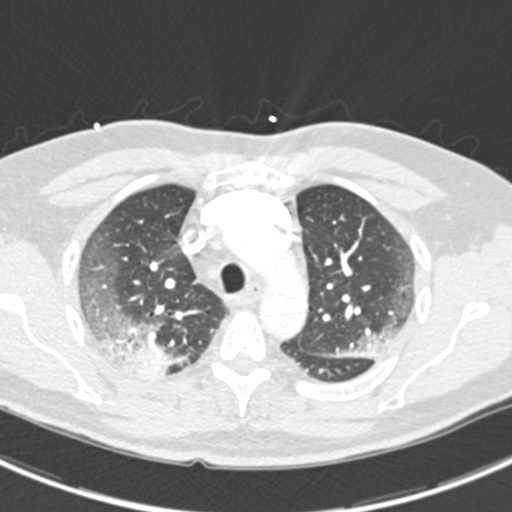
[im 213/272  soft-tissue]
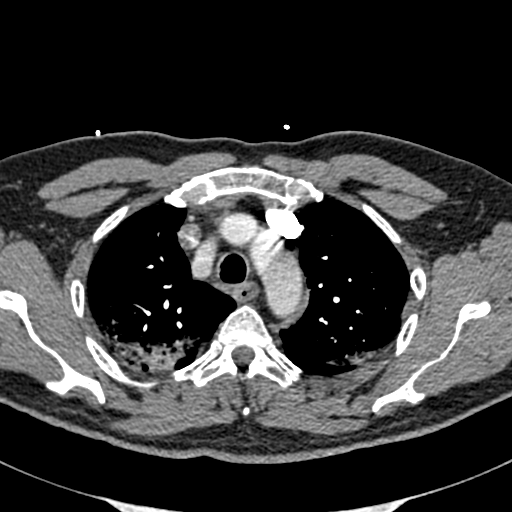
[im 224/272  lung]
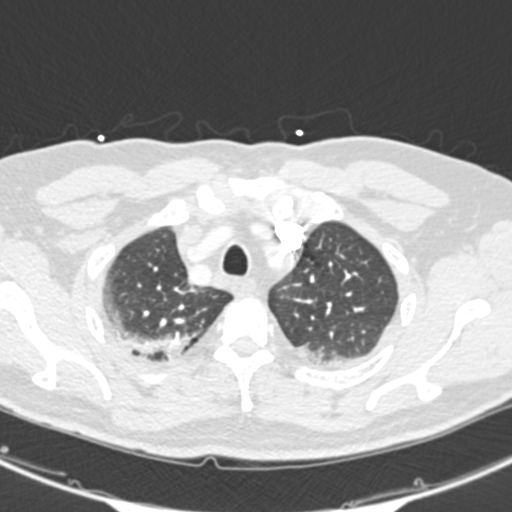
[im 248/272  soft-tissue]
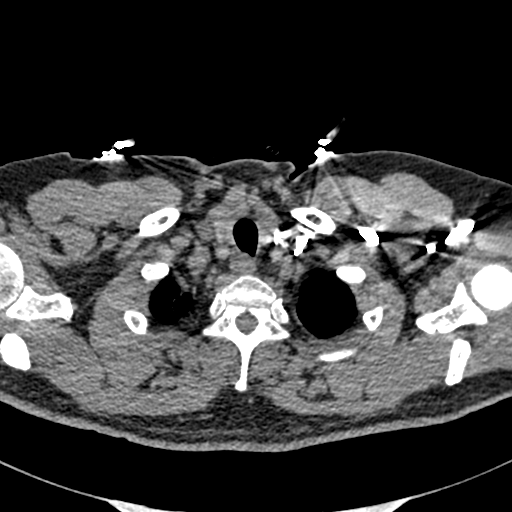
[im 260/272  lung]
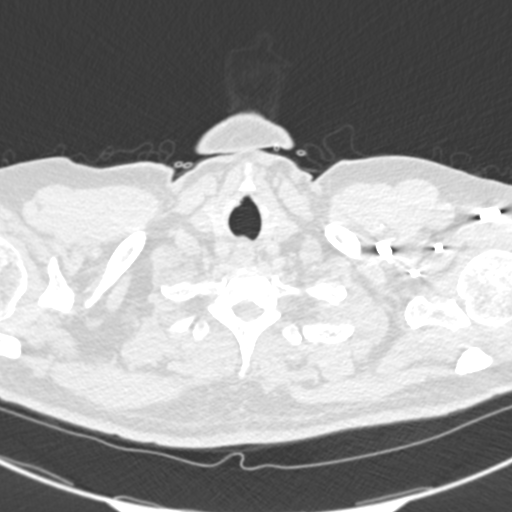

[Series 7: coronal mpr · coronal · 0.53mm/px · 2 of 106 slices shown]
[im 36/106  soft-tissue]
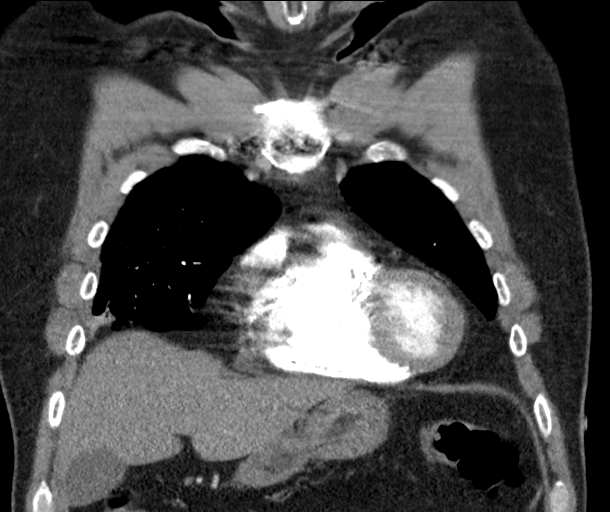
[im 71/106  soft-tissue]
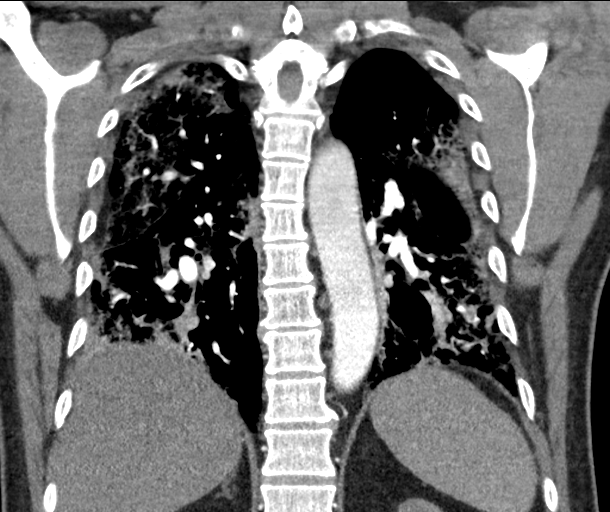

[19 of 46 positions shown; findings below may reference images not displayed]

FINDINGS: Cardiovascular: There is a optimal opacification of the pulmonary
arteries. There is no central,segmental, or subsegmental filling
defects within the pulmonary arteries. The heart is normal in size.
No pericardial effusion or thickening. No evidence right heart
strain. There is normal three-vessel brachiocephalic anatomy without
proximal stenosis. The thoracic aorta is normal in appearance.

Mediastinum/Nodes: No hilar, mediastinal, or axillary adenopathy.
Thyroid gland, trachea, and esophagus demonstrate no significant
findings.

Lungs/Pleura: Multifocal patchy airspace opacities are seen
throughout both lungs, predominantly within the peripheries and lung
bases. No pleural effusion or pneumothorax.

Upper Abdomen: No acute abnormalities present in the visualized
portions of the upper abdomen.

Musculoskeletal: No chest wall abnormality. No acute or significant
osseous findings.

Review of the MIP images confirms the above findings.
IMPRESSION: No central, segmental, or subsegmental pulmonary embolism.

Multifocal airspace opacities, consistent with atypical viral
pneumonia
